# Patient Record
Sex: Female | Born: 1980 | Race: Black or African American | Hispanic: No | Marital: Single | Smoking: Never smoker
Health system: Southern US, Community
[De-identification: ages and names within clinical notes are randomized; demographics above are authoritative.]

## PROBLEM LIST (undated history)

## (undated) DIAGNOSIS — E786 Lipoprotein deficiency: Secondary | ICD-10-CM

## (undated) DIAGNOSIS — G4730 Sleep apnea, unspecified: Secondary | ICD-10-CM

## (undated) DIAGNOSIS — M179 Osteoarthritis of knee, unspecified: Secondary | ICD-10-CM

## (undated) DIAGNOSIS — E119 Type 2 diabetes mellitus without complications: Secondary | ICD-10-CM

## (undated) DIAGNOSIS — M1710 Unilateral primary osteoarthritis, unspecified knee: Secondary | ICD-10-CM

## (undated) DIAGNOSIS — I10 Essential (primary) hypertension: Secondary | ICD-10-CM

## (undated) DIAGNOSIS — F32A Depression, unspecified: Secondary | ICD-10-CM

## (undated) HISTORY — DX: Lipoprotein deficiency: E78.6

## (undated) HISTORY — DX: Osteoarthritis of knee, unspecified: M17.9

## (undated) HISTORY — DX: Morbid (severe) obesity due to excess calories: E66.01

## (undated) HISTORY — DX: Unilateral primary osteoarthritis, unspecified knee: M17.10

## (undated) HISTORY — PX: NO PAST SURGERIES: SHX2092

## (undated) HISTORY — DX: Type 2 diabetes mellitus without complications: E11.9

## (undated) HISTORY — DX: Essential (primary) hypertension: I10

---

## 2005-01-01 ENCOUNTER — Emergency Department (HOSPITAL_COMMUNITY): Admission: EM | Admit: 2005-01-01 | Discharge: 2005-01-01 | Payer: Self-pay | Admitting: Family Medicine

## 2007-12-18 ENCOUNTER — Other Ambulatory Visit: Admission: RE | Admit: 2007-12-18 | Discharge: 2007-12-18 | Payer: Self-pay | Admitting: Family Medicine

## 2012-01-21 ENCOUNTER — Other Ambulatory Visit (HOSPITAL_COMMUNITY)
Admission: RE | Admit: 2012-01-21 | Discharge: 2012-01-21 | Disposition: A | Discharge status: Home / Self Care | Payer: BC Managed Care – PPO | Source: Ambulatory Visit | Attending: Family Medicine | Admitting: Family Medicine

## 2012-01-21 DIAGNOSIS — Z0000 Encounter for general adult medical examination without abnormal findings: Secondary | ICD-10-CM | POA: Insufficient documentation

## 2012-12-09 ENCOUNTER — Other Ambulatory Visit: Payer: Self-pay | Admitting: Family Medicine

## 2012-12-11 ENCOUNTER — Ambulatory Visit
Admission: RE | Admit: 2012-12-11 | Discharge: 2012-12-11 | Disposition: A | Discharge status: Home / Self Care | Payer: BC Managed Care – PPO | Source: Ambulatory Visit | Attending: Family Medicine | Admitting: Family Medicine

## 2014-06-02 ENCOUNTER — Other Ambulatory Visit: Payer: Self-pay | Admitting: Family Medicine

## 2014-06-02 ENCOUNTER — Other Ambulatory Visit (HOSPITAL_COMMUNITY)
Admission: RE | Admit: 2014-06-02 | Discharge: 2014-06-02 | Disposition: A | Discharge status: Home / Self Care | Payer: BC Managed Care – PPO | Source: Ambulatory Visit | Attending: Family Medicine | Admitting: Family Medicine

## 2014-06-02 DIAGNOSIS — Z113 Encounter for screening for infections with a predominantly sexual mode of transmission: Secondary | ICD-10-CM | POA: Diagnosis present

## 2014-06-02 DIAGNOSIS — Z124 Encounter for screening for malignant neoplasm of cervix: Secondary | ICD-10-CM | POA: Insufficient documentation

## 2014-06-03 LAB — CYTOLOGY - PAP

## 2014-09-16 IMAGING — US US ABDOMEN COMPLETE
1 series · 13 of 25 positions shown · non-contrast
Comparison: None.

CLINICAL DATA: Elevated liver function test, on medication for
hypertension and diabetes

COMPLETE ABDOMINAL ULTRASOUND

[Series 1: us abdomen complete · 0.30mm/px · 13 of 70 slices shown]
[im 1/70]
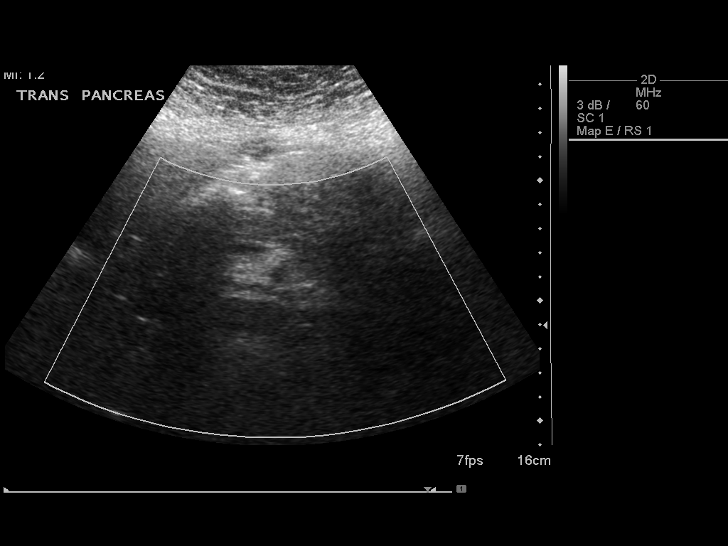
[im 6/70]
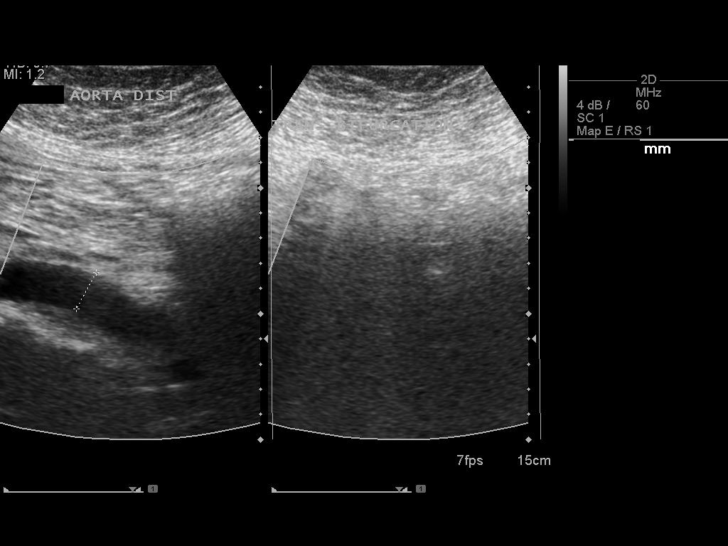
[im 12/70]
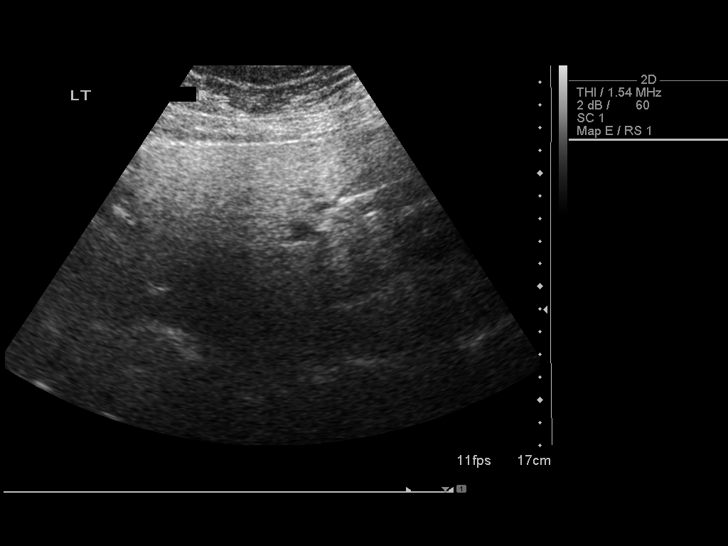
[im 18/70]
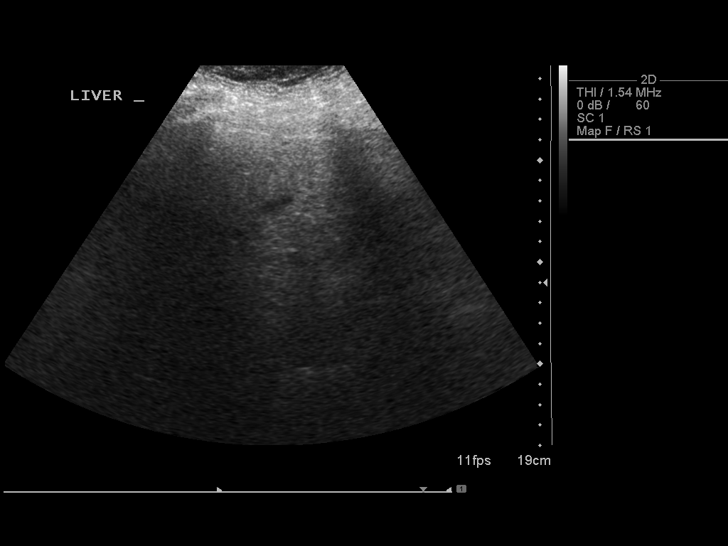
[im 24/70]
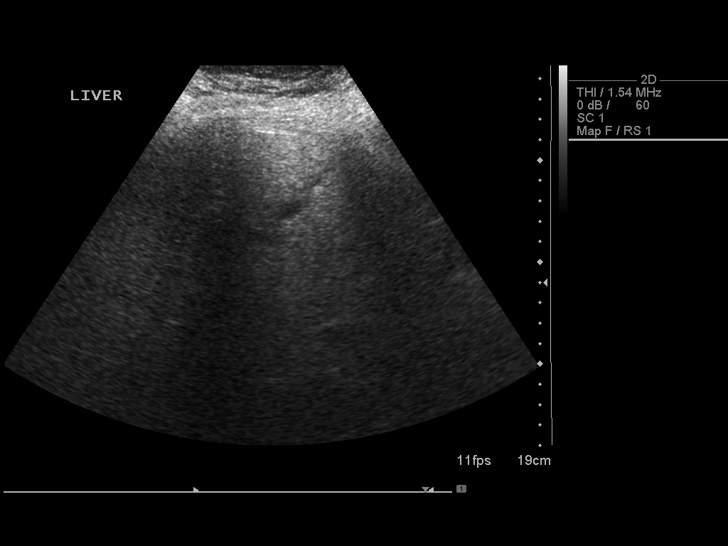
[im 29/70]
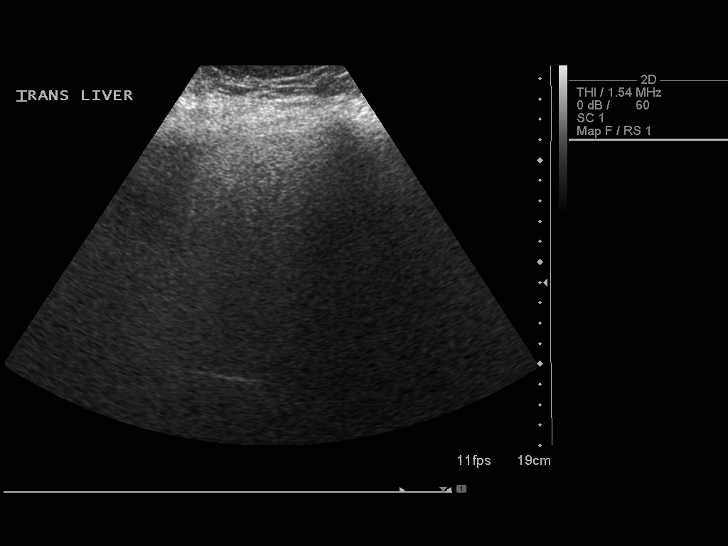
[im 35/70]
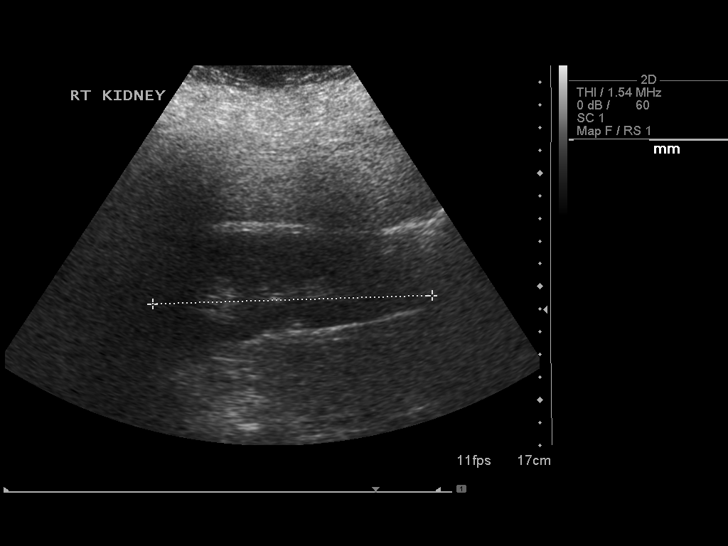
[im 41/70]
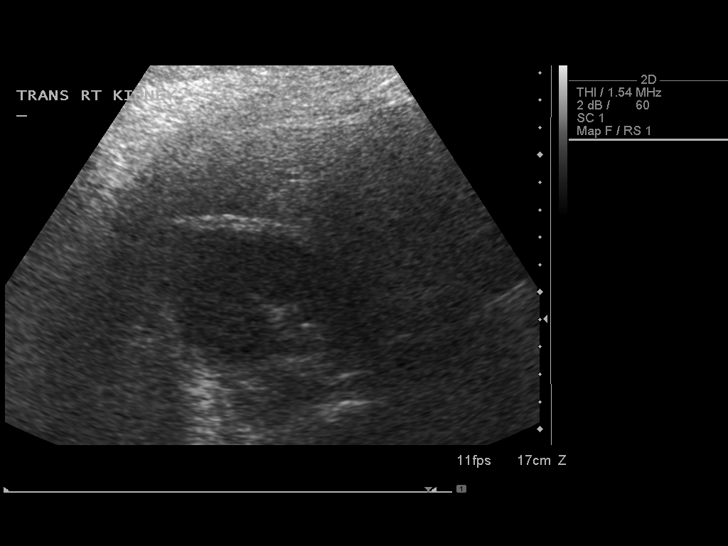
[im 47/70]
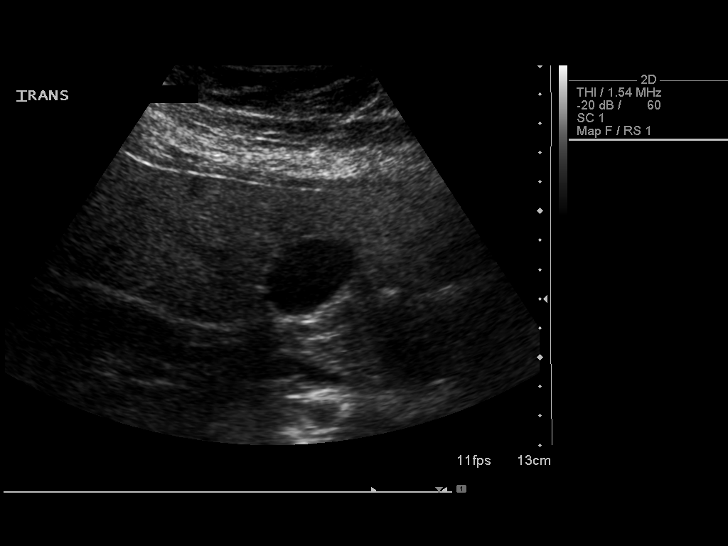
[im 52/70]
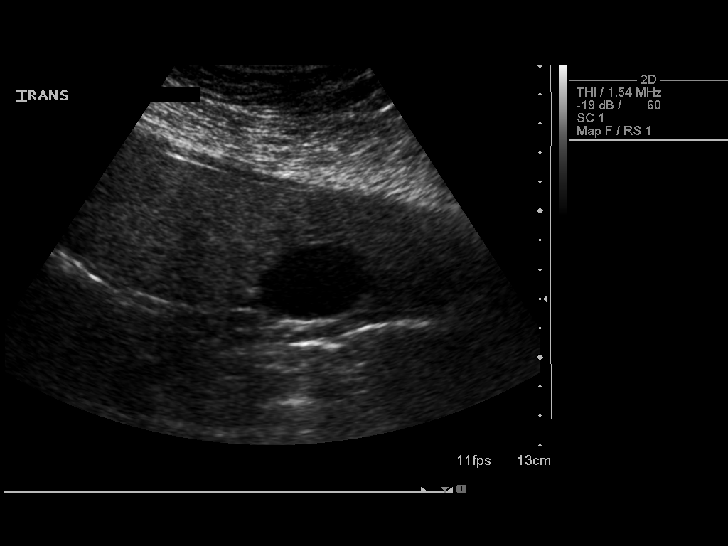
[im 58/70]
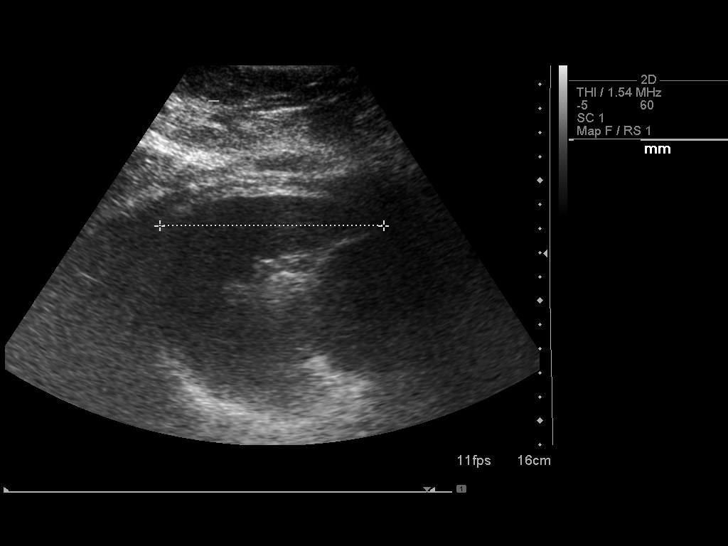
[im 64/70]
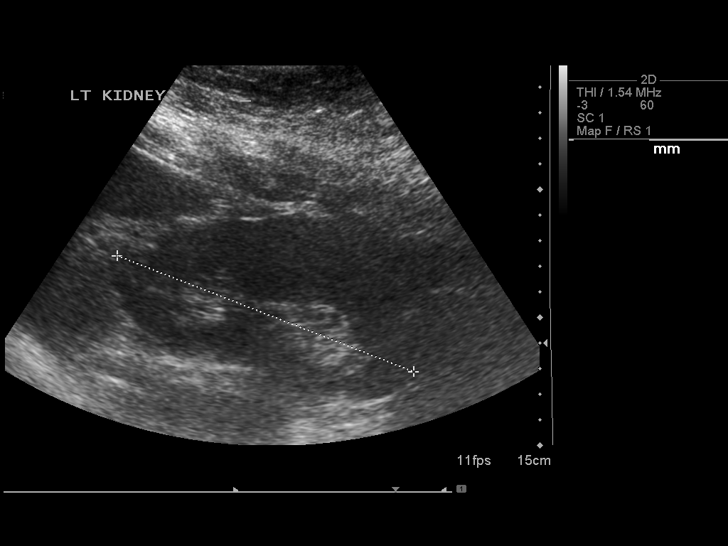
[im 70/70]
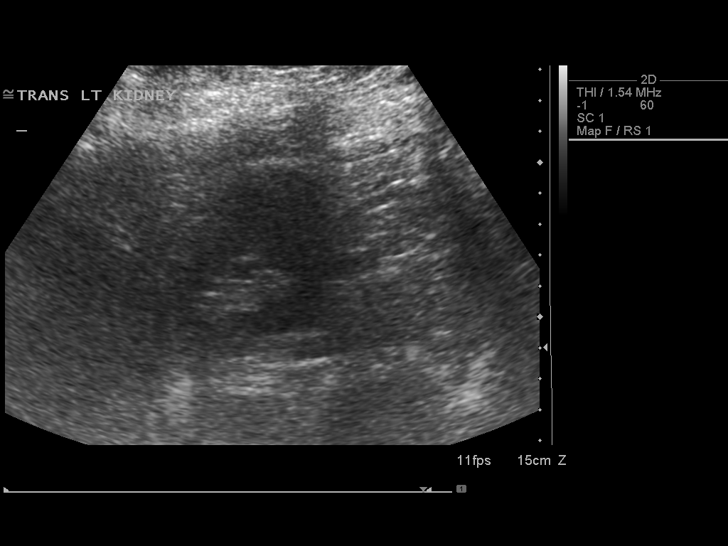

[13 of 25 positions shown; findings below may reference images not displayed]

FINDINGS: Gallbladder:  The gallbladder is visualized and no gallstones are
noted.  There is no pain over the gallbladder with compression.

Common bile duct:  The common bile duct is normal measuring 3.3 mm
in diameter.

Liver:  The liver is diffusely echogenic consistent with fatty
infiltration with probable focal sparing near the gallbladder.  The
liver is prominent measuring 17.8 cm sagittally.

IVC:  The IVC is obscured by body habitus and bowel gas.

Pancreas:  The pancreas also is completely obscured.

Spleen:  The spleen is normal measuring 9.3 cm sagittally.

Right Kidney:  No hydronephrosis is seen.  The right kidney
measures 12.3 cm sagittally.

Left Kidney:  No hydronephrosis is noted.  The left kidney measures
12.5 cm.

Abdominal aorta:  The abdominal aorta is obscured by body habitus.

Much of the anatomy is obscured by large body habitus and bowel
gas.
IMPRESSION: 1.  Echogenic liver parenchyma consistent with fatty infiltration
with areas of sparing.  Hepatomegaly.
2.  No gallstones.
3.  Much of the remainder of the anatomy is obscured by large body
habitus and bowel gas.

## 2015-12-06 ENCOUNTER — Encounter: Payer: Self-pay | Admitting: Neurology

## 2015-12-06 ENCOUNTER — Ambulatory Visit (INDEPENDENT_AMBULATORY_CARE_PROVIDER_SITE_OTHER): Payer: BC Managed Care – PPO | Admitting: Neurology

## 2015-12-06 DIAGNOSIS — R413 Other amnesia: Secondary | ICD-10-CM

## 2015-12-06 DIAGNOSIS — E1165 Type 2 diabetes mellitus with hyperglycemia: Secondary | ICD-10-CM | POA: Insufficient documentation

## 2015-12-06 DIAGNOSIS — I10 Essential (primary) hypertension: Secondary | ICD-10-CM | POA: Diagnosis not present

## 2015-12-06 DIAGNOSIS — R51 Headache: Secondary | ICD-10-CM | POA: Diagnosis not present

## 2015-12-06 DIAGNOSIS — R519 Headache, unspecified: Secondary | ICD-10-CM

## 2015-12-06 DIAGNOSIS — R4789 Other speech disturbances: Secondary | ICD-10-CM

## 2015-12-06 DIAGNOSIS — R4189 Other symptoms and signs involving cognitive functions and awareness: Secondary | ICD-10-CM | POA: Insufficient documentation

## 2015-12-06 DIAGNOSIS — IMO0002 Reserved for concepts with insufficient information to code with codable children: Secondary | ICD-10-CM | POA: Insufficient documentation

## 2015-12-06 DIAGNOSIS — E538 Deficiency of other specified B group vitamins: Secondary | ICD-10-CM | POA: Diagnosis not present

## 2015-12-06 NOTE — Patient Instructions (Signed)
Remember to drink plenty of fluid, eat healthy meals and do not skip any meals. Try to eat protein with a every meal and eat a healthy snack such as fruit or nuts in between meals. Try to keep a regular sleep-wake schedule and try to exercise daily, particularly in the form of walking, 20-30 minutes a day, if you can.   As far as diagnostic testing: MRI of the brain, Labs, Sleep evaluation. If all negative, can discuss Formal Neurocognitive testing  I would like to see you back in 4 months, sooner if we need to. Please call us with any interim questions, concerns, problems, updates or refill requests.   Our phone number is [phone]. We also have an after hours call service for urgent matters and there is a physician on-call for urgent questions. For any emergencies you know to call 911 or go to the nearest emergency room

## 2015-12-06 NOTE — Progress Notes (Signed)
[facility]    Provider:  Dr Lucia GaskinsAhern Referring Provider: Laurann MontanaWhite, Cynthia, MD Primary Care Physician:  Cala BradfordWHITE,CYNTHIA S, MD  CC:  Word-finding difficulty  HPI:  Stacey Graham is a 35 y.o. female here as a referral from Dr. Cliffton AstersWhite for word-finding difficulty. PMHx morbid obesity (BMI 66), uncontrolled diabetes(last hgba1c 10.3), HTN, arthritis, migraines and morning headaches. She is having some mild memory issues, like what she did last week on a certain day but then remembers. She stared noticing the word difficulty the last 2 years, worsening, happening more frequently and more consistently. May be an object, a name, a person, a place. She will find the word or will describe it or someone else will intercede. Happens every conversation that she has. No family history of dementia. Mom is alibe and 51 without any issues, is not close with father, father had Crohn's disease. Maternal grandparents deceased in their 6980s the other in 1960s without any memory issues. She is stressed with a recent job started 2015, she works in [employer]ances at [employer]. No one has noticed, just she notices. She snores, she is morbidly obese (BMI 66), she gets very tired during the day, she feel fatigued even waking up in the morning. She has been under stress the last 2 years but things are better, she is fatigued when she gets home. No significant mood disorder. She has woken up with headaches. She has a history of headaches and migraines. The migraine frequency is occasional and she has to sleep to feel better. She has woken up with headaches as well. She has a hx of migraines.  CMP was performed on 06/18/15 creatinine 0.92. TSH .98  hgba1c 10.3  Review of Systems: Patient complains of symptoms per HPI as well as the following symptoms: no CP, no SOB. Pertinent negatives per HPI. All others negative.   Social History   Social History  . Marital Status: Single    Spouse Name: N/A  . Number of Children: 0  .  Years of Education: 16   Occupational History  . 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 Smoking status: Never Smoker   . Smokeless tobacco: Not on file  . Alcohol Use: 0.0 oz/week    0 Standard drinks or equivalent per week     Comment: 1-2 glasses beer or wine per week  . Drug Use: No  . Sexual Activity: Not on file   Other Topics Concern  . Not on file   Social History Narrative   Lives alone   Caffeine use: coffee/soda/tea daily (1-2 per day)    Family History  Problem Relation Age of Onset  . Crohn's disease Father   . Hypertension Father   . Hypertension Mother   . Diabetes Mother   . CAD Mother   . Sickle cell anemia Maternal Grandmother   . Migraines Neg Hx   . Dementia Neg Hx   . Stroke Neg Hx   . Multiple sclerosis Neg Hx     Past Medical History  Diagnosis Date  . Diabetes mellitus (HCC)   . Hypertension   . OA (osteoarthritis) of knee     right  . HDL deficiency   . Morbid obesity [facility](HCC)     Past Surgical History  Procedure Laterality Date  . No past surgeries      Current Outpatient Prescriptions  Medication Sig Dispense Refill  . Bitter Melon 10 % POWD 1 Dose by Does not apply route.    .Marland Kitchen  Chromium-Cinnamon (CINNAMON PLUS CHROMIUM PO) Take 1 Dose by mouth daily. 2000/400    . levonorgestrel (MIRENA) 20 MCG/24HR IUD 1 each by Intrauterine route once.    Marland Kitchen lisinopril-hydrochlorothiazide (PRINZIDE,ZESTORETIC) 20-12.5 MG tablet Take 1 tablet by mouth daily.  1  . meloxicam (MOBIC) 15 MG tablet Take 15 mg by mouth daily.  1  . metFORMIN (GLUCOPHAGE) 1000 MG tablet Take 1,000 mg by mouth 2 (two) times daily.  1  . Probiotic Product (PROBIOTIC PO) Take 1 Dose by mouth daily. Yeast guard    . VITAMIN D, ERGOCALCIFEROL, PO Take 1,000 Units by mouth daily.     No current facility-administered medications for this visit.    Allergies as of 12/06/2015  . (Not on File)    Vitals: BP 128/77 mmHg  Pulse 83  Ht 5' 5.5" (1.664  m)  Wt 406 lb (184.16 kg)  BMI 66.51 kg/m2 Last Weight:  Wt Readings from Last 1 Encounters:  12/06/15 406 lb (184.16 kg)   Last Height:   Ht Readings from Last 1 Encounters:  12/06/15 5' 5.5" (1.664 m)    Physical exam: Exam: Gen: NAD, conversant, well nourised, morbidly obese, well groomed                     CV: RRR, no MRG. No Carotid Bruits. No peripheral edema, warm, nontender Eyes: Conjunctivae clear without exudates or hemorrhage  Neuro: Detailed Neurologic Exam  Speech:    Speech is normal; fluent and spontaneous with normal comprehension.  Cognition:    The patient is oriented to person, place, and time;     recent and remote memory intact;     language fluent;     normal attention, concentration,     fund of knowledge Cranial Nerves:    The pupils are equal, round, and reactive to light. The fundi are normal and spontaneous venous pulsations are present. Visual fields are full to finger confrontation. Extraocular movements are intact. Trigeminal sensation is intact and the muscles of mastication are normal. The face is symmetric. The palate elevates in the midline. Hearing intact. Voice is normal. Shoulder shrug is normal. The tongue has normal motion without fasciculations.   Coordination:    Normal finger to nose and heel to shin.    Gait:    Wide based due to exceptionally large body habitus  Motor Observation:    No asymmetry, no atrophy, and no involuntary movements noted. Tone:    Normal muscle tone.    Posture:    Posture is normal. normal erect    Strength:    Strength is V/V in the upper and lower limbs.      Sensation: intact to LT     Reflex Exam:  DTR's:    Deep tendon reflexes in the upper are normal and limited by severe morbid obesity in the lowers.   Toes:    The toes are equivocal bilaterally.   Clonus:    Clonus is absent.      Assessment/Plan:  ncontrolled diabetes(last hgba1c 10.3), HTN, arthritis. She is having some mild  memory issues.  Memory loss: Sleep eval: Morning headache, snoring, daytime fatigue, memory changes, morbid obesity (BMI 66) MRI of the brain - BMI 66 may need open MRI Check B12 and folate  Headaches: To prevent or relieve headaches, try the following: Cool Compress. Lie down and place a cool compress on your head.  Avoid headache triggers. If certain foods or odors seem to have triggered your  migraines in the past, avoid them. A headache diary might help you identify triggers.  Include physical activity in your daily routine. Try a daily walk or other moderate aerobic exercise.  Manage stress. Find healthy ways to cope with the stressors, such as delegating tasks on your to-do list.  Practice relaxation techniques. Try deep breathing, yoga, massage and visualization.  Eat regularly. Eating regularly scheduled meals and maintaining a healthy diet might help prevent headaches. Also, drink plenty of fluids.  Follow a regular sleep schedule. Sleep deprivation might contribute to headaches Consider biofeedback. With this mind-body technique, you learn to control certain bodily functions - such as muscle tension, heart rate and blood pressure - to prevent headaches or reduce headache pain.    Proceed to emergency room if you experience new or worsening symptoms or symptoms do not resolve, if you have new neurologic symptoms or if headache is severe, or for any concerning symptom.      Naomie Dean, MD  [facility] [address] [place], [place] [address]  Phone [phone] Fax [phone]

## 2015-12-07 LAB — CREATININE, SERUM
Creatinine, Ser: 1.21 mg/dL — ABNORMAL HIGH (ref 0.57–1.00)
GFR calc Af Amer: 67 mL/min/{1.73_m2} (ref >59)
GFR calc non Af Amer: 58 mL/min/{1.73_m2} — ABNORMAL LOW (ref >59)

## 2015-12-07 LAB — B12 AND FOLATE PANEL
Folate: >20 ng/mL (ref >3.0)
Vitamin B-12: 589 pg/mL (ref 211–946)

## 2015-12-08 ENCOUNTER — Telehealth: Payer: Self-pay | Admitting: *Deleted

## 2015-12-08 NOTE — Telephone Encounter (Signed)
-----   Message from Anson FretAntonia B Ahern, MD sent at 12/08/2015 10:41 AM EDT ----- B12 looks great. Her creatinine is elevated at 1.21, she should follow up with her primary care about that and have it repeated in the next few months to ensure there is nothing wrong with her kidneys thanks

## 2015-12-08 NOTE — Telephone Encounter (Signed)
LVM for pt to call about results. Gave [facility] phone number.  

## 2015-12-12 NOTE — Telephone Encounter (Signed)
LVM requesting patient call back for lab results. Left name, number.

## 2015-12-21 ENCOUNTER — Ambulatory Visit (INDEPENDENT_AMBULATORY_CARE_PROVIDER_SITE_OTHER): Payer: BC Managed Care – PPO | Admitting: Neurology

## 2015-12-21 ENCOUNTER — Encounter: Payer: Self-pay | Admitting: Neurology

## 2015-12-21 VITALS — BP 140/80 | HR 86 | Resp 20 | Ht 65.5 in | Wt 393.0 lb

## 2015-12-21 DIAGNOSIS — E669 Obesity, unspecified: Secondary | ICD-10-CM

## 2015-12-21 DIAGNOSIS — R51 Headache: Secondary | ICD-10-CM

## 2015-12-21 DIAGNOSIS — R0683 Snoring: Secondary | ICD-10-CM | POA: Diagnosis not present

## 2015-12-21 DIAGNOSIS — E662 Morbid (severe) obesity with alveolar hypoventilation: Secondary | ICD-10-CM

## 2015-12-21 DIAGNOSIS — R519 Headache, unspecified: Secondary | ICD-10-CM | POA: Insufficient documentation

## 2015-12-21 NOTE — Telephone Encounter (Signed)
Spoke to pt - she is aware of results and will follow up w/ PCP.

## 2015-12-21 NOTE — Patient Instructions (Signed)

## 2015-12-21 NOTE — Telephone Encounter (Signed)
Called and spoke to pt about lab results per Dr Ahern note. Pt verbalized understanding.  

## 2015-12-21 NOTE — Telephone Encounter (Signed)
Sent  Copy of labs to PCP per pt request VIA EPIC

## 2015-12-21 NOTE — Progress Notes (Signed)
[facility]   Provider:  Melvyn Novas, M D  Referring Provider:  Dr Naomie Dean, MD   Primary Care Physician:  Cala Bradford, MD  Chief Complaint  Patient presents with  . New Patient (Initial Visit)    snores, never had sleep study    HPI:  Stacey Graham is a 35 y.o. female , seen here as a referral  from Dr. 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 She is referred via Dr. Naomie Dean by whom she has been evaluated for word finding and cognitive memory issues. She had some trouble with naming objects. The same was naming people. Dr. Lucia Gaskins learned that she was tired during the day felt fatigued even when she woke up in the morning and has a history of headaches and migraines -she has woken up with headaches, she has never been woken by headaches. Usually, sleep is helpful to alleviate the headaches and she can sometimes "sleep them off ". Stacey Graham considers herself at night while meaning that she feels productive and actually alert and the latest hours of the day. She likes a later than average bedtime, and around her menstrual period she craves 9 or 10 hours of sleep. 2 years ago she begun working in a new responsibility at [employer] and her workday often  exceeded 12 hours. She noted that this fatigue her. She has cut back to an 8 hour or close to 8 hour work day and she feels that that has preserved some of her energies. She does not report a significant change in mood does not feel significantly depressed is not in pain or distress, she feels excessively  tired from the end of the workday on.  Sleep habits are as follows:  She wakes usually up with the help of her alarm and she is concerned that if she wouldn't have the alarm she would sleep and. This also contributes to the feeling of not being restored or refreshed in the morning. He was hit the snooze button several times.  Rising has not been easy for her. She does not take breakfast at home, she does not drink any coffee or tea in the morning she drinks it at her workplace however and she starts working usually around 7:30 and 8 AM her alarm is set for 6 .30 AM. Her workday and between 5 and 5:30, her workplace has daylight access she has a large window across her desk. She does take naps during lunch times- but shares an office.  In her bedroom she does watch TV but she puts it on a timer. She only recalls the first 5-10 minutes of each show she watches at night so it is a strong feeling that she is sleep with a number of minutes. She sleeps prone or on her side when she falls asleep. Friends and family have told her that she snores loudly but she sleeps usually alone. She uses to double stuff pillows. The bedroom is usually cool, quiet and dark she prefers to run a fan in the background. Currently she has more frequent nocturia as a result of diabetes medication. Nocturia is reported to take place 1-3 times each night. She can go to sleep easily after bathroom break.   Sleep medical history and family sleep history:  The patient's parents have no known diagnosis of apnea, the contact with her father is not close by  to her mother.   Social history:  She does not use tobacco products of any kind, she drinks our goal 1-2 drinks a week, caffeine use 1-2 cups of coffee a day no soda nor iced tea.  Review of Systems: Out of a complete 14 system review, the patient complains of only the following symptoms, and all other reviewed systems are negative. Stacey Graham reports nocturia, snoring, feeling daytime fatigue but having the same energy. She also was diagnosed with uncontrolled diabetes and in her last visit with Dr. Lucia Gaskins she mentioned an Hgb A1c at 10.3, when she had a repeat test done it was 8.6. Her BMI is super obese at 18 and this has also contributed to arthritis and knee pain, we will have to evaluate for the  possibility of obesity hypoventilation.  Epworth score 7 , Fatigue severity score 39  , depression score 2/15    Social History   Social History  . Marital status: Single    Spouse name: N/A  . Number of children: 0  . Years of education: 53   Occupational History  . Presenter, broadcasting at [employer]    Social History Main Topics  . Smoking status: Never Smoker  . Smokeless tobacco: Not on file  . Alcohol use 0.0 oz/week     Comment: 1-2 glasses beer or wine per week  . Drug use: No  . Sexual activity: Not on file   Other Topics Concern  . Not on file   Social History Narrative   Lives alone   Caffeine use: coffee/soda/tea daily (1-2 per day)    Family History  Problem Relation Age of Onset  . Crohn's disease Father   . Hypertension Father   . Hypertension Mother   . Diabetes Mother   . CAD Mother   . Sickle cell anemia Maternal Grandmother   . Migraines Neg Hx   . Dementia Neg Hx   . Stroke Neg Hx   . Multiple sclerosis Neg Hx     Past Medical History:  Diagnosis Date  . Diabetes mellitus (HCC)   . HDL deficiency   . Hypertension   . Morbid obesity (HCC)   . OA (osteoarthritis) of knee    right    Past Surgical History:  Procedure Laterality Date  . NO PAST SURGERIES      Current Outpatient Prescriptions  Medication Sig Dispense Refill  . Bitter Melon 10 % POWD 1 Dose by Does not apply route.    . Chromium-Cinnamon (CINNAMON PLUS CHROMIUM PO) Take 1 Dose by mouth daily. 2000/400    . JARDIANCE 25 MG TABS tablet Take 25 mg by mouth daily.    Marland Kitchen levonorgestrel (MIRENA) 20 MCG/24HR IUD 1 each by Intrauterine route once.    Marland Kitchen lisinopril-hydrochlorothiazide (PRINZIDE,ZESTORETIC) 20-12.5 MG tablet Take 1 tablet by mouth daily.  1  . meloxicam (MOBIC) 15 MG tablet Take 15 mg by mouth daily.  1  . metFORMIN (GLUCOPHAGE) 1000 MG tablet Take 1,000 mg by mouth 2 (two) times daily.  1  . Probiotic Product (PROBIOTIC PO) Take 1 Dose by mouth daily. Yeast guard      . VITAMIN D, ERGOCALCIFEROL, PO Take 1,000 Units by mouth daily.     No current facility-administered medications for this visit.     Allergies as of 12/21/2015  . (No Known Allergies)    Vitals: BP 140/80   Pulse 86   Resp 20   Ht 5' 5.5" (1.664 m)   Wt (!) 393  lb (178.3 kg)   BMI 64.40 kg/m  Last Weight:  Wt Readings from Last 1 Encounters:  12/21/15 (!) 393 lb (178.3 kg)   ZOX:WRUE mass index is 64.4 kg/m.     Last Height:   Ht Readings from Last 1 Encounters:  12/21/15 5' 5.5" (1.664 m)    Physical exam:  General: The patient is awake, alert and appears not in acute distress. The patient is well groomed. Head: Normocephalic, atraumatic. Neck is supple. Mallampati 3 neck circumference:19.25 . Nasal airflow restricted , TMJ is  Not  evident . Retrognathia is seen.  Cardiovascular:  Regular rate and rhythm , without  murmurs or carotid bruit, and without distended neck veins. Respiratory: Lungs are clear to auscultation. Skin:  Without evidence of edema, or rash Trunk: BMI is super obese . The patient's posture is erect   Neurologic exam : The patient is awake and alert, oriented to place and time.   Attention span & concentration ability appears normal.  Speech is fluent,  without dysarthria, dysphonia or aphasia.  Mood and affect are appropriate.  Cranial nerves: Pupils are equal and briskly reactive to light. Funduscopic exam without  evidence of pallor or edema. Extraocular movements  in vertical and horizontal planes intact and without nystagmus. Visual fields by finger perimetry are intact. Hearing to finger rub intact.   Facial sensation intact to fine touch.  Facial motor strength is symmetric and tongue and uvula move midline. Shoulder shrug was symmetrical.   Motor exam:   Normal tone, muscle bulk and symmetric strength in all extremities.  Sensory:  Fine touch, pinprick and vibration were tested in all extremities. Proprioception tested in the upper  extremities was normal.  Coordination: Rapid alternating movements in the fingers/hands was normal. Finger-to-nose maneuver  normal without evidence of ataxia, dysmetria or tremor.  Gait and station: Patient walks without assistive device and is able unassisted to climb up to the exam table. Strength within normal limits.  Stance is stable and normal.  Turns with Steps. Romberg testing is negative.  Deep tendon reflexes: in the upper and lower extremities are symmetric and intact. Babinski maneuver response is  downgoing.  The patient was advised of the nature of the diagnosed sleep disorder , the treatment options and risks for general a health and wellness arising from not treating the condition.  I spent more than 40 minutes of face to face time with the patient. Greater than 50% of time was spent in counseling and coordination of care. We have discussed the diagnosis and differential and I answered the patient's questions.     Assessment:  After physical and neurologic examination, review of laboratory studies,  Personal review of imaging studies, reports of other /same  Imaging studies ,  Results of polysomnography/ neurophysiology testing and pre-existing records as far as provided in visit., my assessment is   1) I agree with Dr. Trevor Mace assessment that Stacey Graham is a very high risk of having obstructive sleep apnea and given her body mass index she may have obesity hypoventilation. Sleep related headaches as she reports can be related to hypercapnia and hypoxemia at night. She has been witnessed to snore loudly over several years now. Her daytime fatigue can be contributed to by her uncontrolled diabetes and recently medication regimen and diet have changed. This has also led to some nocturia. My goal is to evaluate the patient in a split-night polysomnography with capnography and to treat her with CPAP if she qualifies for the first  2 hours of sleep. For a patient with her risk factors I  would not recommend an unattended sleep study. Should hypoxemia or hypercapnia be found and be present for prolonged periods of time as CPAP or BiPAP is usually the treatment of choice. The patient has mild apnea is mainly snoring and does not suffer from hypoxemia, a dental device can be applied.   Plan:  Treatment plan and additional workup :  SPLIT night , Capnography.      Stacey Mylar Taisia Fantini MD  12/21/2015   CC: Laurann Montana, Md [address] [place], [place] [address]

## 2015-12-28 ENCOUNTER — Other Ambulatory Visit: Payer: BC Managed Care – PPO

## 2016-01-01 ENCOUNTER — Ambulatory Visit (INDEPENDENT_AMBULATORY_CARE_PROVIDER_SITE_OTHER): Payer: BC Managed Care – PPO | Admitting: Neurology

## 2016-01-01 DIAGNOSIS — E669 Obesity, unspecified: Secondary | ICD-10-CM

## 2016-01-01 DIAGNOSIS — R51 Headache: Secondary | ICD-10-CM

## 2016-01-01 DIAGNOSIS — G4733 Obstructive sleep apnea (adult) (pediatric): Secondary | ICD-10-CM

## 2016-01-01 DIAGNOSIS — E662 Morbid (severe) obesity with alveolar hypoventilation: Secondary | ICD-10-CM

## 2016-01-01 DIAGNOSIS — R0683 Snoring: Secondary | ICD-10-CM

## 2016-01-01 DIAGNOSIS — R519 Headache, unspecified: Secondary | ICD-10-CM

## 2016-01-03 ENCOUNTER — Ambulatory Visit
Admission: RE | Admit: 2016-01-03 | Discharge: 2016-01-03 | Disposition: A | Discharge status: Home / Self Care | Payer: BC Managed Care – PPO | Source: Ambulatory Visit | Attending: Neurology | Admitting: Neurology

## 2016-01-03 DIAGNOSIS — R413 Other amnesia: Secondary | ICD-10-CM

## 2016-01-03 DIAGNOSIS — R4789 Other speech disturbances: Secondary | ICD-10-CM | POA: Diagnosis not present

## 2016-01-03 DIAGNOSIS — R519 Headache, unspecified: Secondary | ICD-10-CM

## 2016-01-03 DIAGNOSIS — R51 Headache: Secondary | ICD-10-CM

## 2016-01-03 MED ORDER — GADOBENATE DIMEGLUMINE 529 MG/ML IV SOLN
20.0000 mL | Freq: Once | INTRAVENOUS | Status: AC | PRN
  Administered 2016-01-03: 20 mL via INTRAVENOUS

## 2016-01-06 ENCOUNTER — Telehealth: Payer: Self-pay | Admitting: *Deleted

## 2016-01-06 NOTE — Telephone Encounter (Signed)
Spoke to pt and relayed the results of her normal MRI.  She verbalized understanding.

## 2016-01-06 NOTE — Telephone Encounter (Signed)
-----   Message from Anson FretAntonia B Ahern, MD sent at 01/05/2016  5:53 PM EDT ----- Mri of the brain normal for age thanks

## 2016-01-10 ENCOUNTER — Telehealth: Payer: Self-pay

## 2016-01-10 DIAGNOSIS — G4733 Obstructive sleep apnea (adult) (pediatric): Secondary | ICD-10-CM

## 2016-01-10 NOTE — Telephone Encounter (Signed)
I spoke to pt. I advised her that her sleep study results showed that she does have sleep apnea, but cpap was effective in treating her apnea, and that Dr. Vickey Hugerohmeier recommends starting a cpap. Pt is agreeable to starting a cpap. I advised her that I would send her cpap order to [facility] and they would call her within a week to get it set up. I advised pt that her PCP may check her thyroid function to rule out hypothyroidism as clinically indicated. Pt is not taking narcotics at this time. I advised pt to avoid caffeine containing beverages and chocolate. Pt verbalized understanding of results. Pt asked that a copy of her sleep study be faxed to Dr. Cliffton AstersWhite. A follow up appt was made for 10/17 at 3:30. Pt had no questions at this time but was encouraged to call back if questions arise.

## 2016-03-13 ENCOUNTER — Ambulatory Visit: Payer: Self-pay | Admitting: Neurology

## 2016-03-19 ENCOUNTER — Telehealth: Payer: Self-pay

## 2016-03-19 ENCOUNTER — Encounter: Payer: Self-pay | Admitting: Neurology

## 2016-03-19 NOTE — Telephone Encounter (Signed)
I spoke to pt and advised her that her 03/22/2016 appt with Dr. Vickey Hugerohmeier will need to be r/s because Dr. Vickey Hugerohmeier will not be seeing pts that day. Pt is agreeable to a 03/11/2016 at 9:30 am appt. Pt says this is the second time her appt has been rescheduled. Pt verbalized understanding of new appt date and time.

## 2016-03-21 ENCOUNTER — Encounter: Payer: Self-pay | Admitting: Neurology

## 2016-03-21 ENCOUNTER — Ambulatory Visit (INDEPENDENT_AMBULATORY_CARE_PROVIDER_SITE_OTHER): Payer: BC Managed Care – PPO | Admitting: Neurology

## 2016-03-21 VITALS — BP 118/78 | HR 68 | Resp 20 | Ht 66.0 in | Wt 399.0 lb

## 2016-03-21 DIAGNOSIS — G4733 Obstructive sleep apnea (adult) (pediatric): Secondary | ICD-10-CM | POA: Diagnosis not present

## 2016-03-21 DIAGNOSIS — Z9989 Dependence on other enabling machines and devices: Secondary | ICD-10-CM

## 2016-03-21 NOTE — Patient Instructions (Signed)

## 2016-03-21 NOTE — Progress Notes (Signed)
SLEEP MEDICINE CLINIC   Provider:  Melvyn Novas, M D  Referring Provider:  Dr Naomie Dean, MD   Primary Care Physician:  Cala Bradford, MD  Chief Complaint  Patient presents with  . Follow-up    cpap    HPI:  Stacey Graham is a 35 y.o. female , seen here as a referral  from Dr. 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 She is referred via Dr. Naomie Dean by whom she has been evaluated for word finding and cognitive memory issues. She had some trouble with naming objects. The same was naming people. Dr. Lucia Gaskins learned that she was tired during the day felt fatigued even when she woke up in the morning and has a history of headaches and migraines -she has woken up with headaches, she has never been woken by headaches. Usually, sleep is helpful to alleviate the headaches and she can sometimes "sleep them off ". Stacey Graham considers herself at night while meaning that she feels productive and actually alert and the latest hours of the day. She likes a later than average bedtime, and around her menstrual period she craves 9 or 10 hours of sleep. 2 years ago she begun working in a new responsibility at [employer] and her workday often  exceeded 12 hours. She noted that this fatigue her. She has cut back to an 8 hour or close to 8 hour work day and she feels that that has preserved some of her energies. She does not report a significant change in mood does not feel significantly depressed is not in pain or distress, she feels excessively  tired from the end of the workday on.  Sleep habits are as follows: She wakes usually up with the help of her alarm and she is concerned that if she wouldn't have the alarm she would sleep and. This also contributes to the feeling of not being restored or refreshed in the morning. He was hit the snooze button several times. Rising has not been easy for her. She does not  take breakfast at home, she does not drink any coffee or tea in the morning she drinks it at her workplace however and she starts working usually around 7:30 and 8 AM her alarm is set for 6 .30 AM. Her workday and between 5 and 5:30, her workplace has daylight access she has a large window across her desk. She does take naps during lunch times- but shares an office.  In her bedroom she does watch TV but she puts it on a timer. She only recalls the first 5-10 minutes of each show she watches at night so it is a strong feeling that she is sleep with a number of minutes. She sleeps prone or on her side when she falls asleep. Friends and family have told her that she snores loudly but she sleeps usually alone. She uses to double stuff pillows. The bedroom is usually cool, quiet and dark she prefers to run a fan in the background. Currently she has more frequent nocturia as a result of diabetes medication. Nocturia is reported to take place 1-3 times each night. She can go to sleep easily after bathroom break.   Sleep medical history and family sleep history:  The patient's parents have no known diagnosis of apnea, the contact with her father is not close by to her mother. Social history:  She does  not use tobacco products of any kind, she drinks our goal 1-2 drinks a week, caffeine use 1-2 cups of coffee a day no soda nor iced tea.  Interval history from 03/21/2016. I see Stacey Graham today, an established patient of Dr. Naomie Dean. The patient underwent a split-night polysomnography on 01/01/2016 was diagnosed with an AHI of 31.8 per hour of sleep without significant supine exenteration and without significant REM accentuation. She also did not have periodic limb movements she had some oxygen desaturation and was titrated to a pressure of CPAP at 10 cm water reaching an AHI of 0.0 I prescribed CPAP at 10 cm water with a ResMed air-fluid P 10 which can be exchanged for another interface depending on the  patient's liking. She has had some trouble with dislodging the nasal pillow when sleeping on her side.  I'm also able today to see her compliance record it was 93% for use days and 60% for over 4 hours of consecutive use. Her average user time is 4 hours 41 minutes at 10 cm water pressure with 3 cm EPR and her residual AHI is ideal at 0.4 apneas per hour I did see high air leaks in some nights. I like to add that the Epworth score has been decreased from 7-4 points and the fatigue severity score from 39 to 29.   Review of Systems: Out of a complete 14 system review, the patient complains of only the following symptoms, and all other reviewed systems are negative. Stacey Graham reports nocturia, snoring, feeling daytime fatigue but having the same energy. She also was diagnosed with uncontrolled diabetes and in her last visit with Dr. Lucia Gaskins she mentioned an Hgb A1c at 10.3, when she had a repeat test done it was 8.6. Her BMI is super obese at 39 and this has also contributed to arthritis and knee pain, we will have to evaluate for the possibility of obesity hypoventilation.   Epworth score 4 , Fatigue severity score 29  , depression score 2/15    Social History   Social History  . Marital status: Single    Spouse name: N/A  . Number of children: 0  . Years of education: 75   Occupational History  . Presenter, broadcasting at [employer]    Social History Main Topics  . Smoking status: Never Smoker  . Smokeless tobacco: Not on file  . Alcohol use 0.0 oz/week     Comment: 1-2 glasses beer or wine per week  . Drug use: No  . Sexual activity: Not on file   Other Topics Concern  . Not on file   Social History Narrative   Lives alone   Caffeine use: coffee/soda/tea daily (1-2 per day)    Family History  Problem Relation Age of Onset  . Crohn's disease Father   . Hypertension Father   . Hypertension Mother   . Diabetes Mother   . CAD Mother   . Sickle cell anemia Maternal Grandmother    . Migraines Neg Hx   . Dementia Neg Hx   . Stroke Neg Hx   . Multiple sclerosis Neg Hx     Past Medical History:  Diagnosis Date  . Diabetes mellitus (HCC)   . HDL deficiency   . Hypertension   . Morbid obesity (HCC)   . OA (osteoarthritis) of knee    right    Past Surgical History:  Procedure Laterality Date  . NO PAST SURGERIES      Current Outpatient Prescriptions  Medication Sig Dispense Refill  . Bitter Melon 10 % POWD 1 Dose by Does not apply route.    . Chromium-Cinnamon (CINNAMON PLUS CHROMIUM PO) Take 1 Dose by mouth daily. 2000/400    . levonorgestrel (MIRENA) 20 MCG/24HR IUD 1 each by Intrauterine route once.    Marland Kitchen lisinopril-hydrochlorothiazide (PRINZIDE,ZESTORETIC) 20-12.5 MG tablet Take 1 tablet by mouth daily.  1  . meloxicam (MOBIC) 15 MG tablet Take 15 mg by mouth daily.  1  . Probiotic Product (PROBIOTIC PO) Take 1 Dose by mouth daily. Yeast guard    . SYNJARDY 12.09-998 MG TABS Take 1 tablet by mouth 2 (two) times daily.    Marland Kitchen VITAMIN D, ERGOCALCIFEROL, PO Take 1,000 Units by mouth daily.     No current facility-administered medications for this visit.     Allergies as of 03/21/2016  . (No Known Allergies)    Vitals: BP 118/78   Pulse 68   Resp 20   Ht 5\' 6"  (1.676 m)   Wt (!) 399 lb (181 kg)   BMI 64.40 kg/m  Last Weight:  Wt Readings from Last 1 Encounters:  03/21/16 (!) 399 lb (181 kg)   MVH:QION mass index is 64.4 kg/m.     Last Height:   Ht Readings from Last 1 Encounters:  03/21/16 5\' 6"  (1.676 m)    Physical exam:  General: The patient is awake, alert and appears not in acute distress. The patient is well groomed. Head: Normocephalic, atraumatic. Neck is supple. Mallampati 3 neck circumference:19.25 . Nasal airflow restricted , TMJ is  Not  evident . Retrognathia is seen.  Cardiovascular:  Regular rate and rhythm , without  murmurs or carotid bruit, and without distended neck veins. Respiratory: Lungs are clear to  auscultation. Skin:  Without evidence of edema, or rash Trunk: BMI is super obese . The patient's posture is erect   Neurologic exam : The patient is awake and alert, oriented to place and time.   Cranial nerves: Pupils are equal and briskly reactive to light. Extraocular movements  in vertical and horizontal planes intact and without nystagmus. Visual fields by finger perimetry are intact. Hearing to finger rub intact. Facial sensation intact to fine touch. Facial motor strength is symmetric and tongue and uvula move midline. Shoulder shrug was symmetrical.   Motor exam:  Normal tone, muscle bulk and symmetric strength in all extremities.  The patient was advised of the nature of the diagnosed sleep disorder , the treatment options and risks for general a health and wellness arising from not treating the condition.  I spent more than 25 minutes of face to face time with the patient. Greater than 50% of time was spent in counseling and coordination of care. We have discussed the diagnosis and differential and I answered the patient's questions.     Assessment:  After physical and neurologic examination, review of laboratory studies,  Personal review of imaging studies, reports of other /same  Imaging studies ,  Results of polysomnography/ neurophysiology testing and pre-existing records as far as provided in visit., my assessment is   1) I agree with Dr. Trevor Mace assessment that Stacey Graham is a very high risk of having obstructive sleep apnea -given her body mass index . Sleep related headaches as she reports can be related to hypercapnia and hypoxemia at night. She has been witnessed to snore loudly over several years now. Her daytime fatigue can be contributed to by her uncontrolled diabetes and recently medication regimen and diet  have changed. This has also led to some nocturia.   Based on his sleep study she was diagnosed was rather moderate to severe apnea and prolonged oxygen  desaturations, a dental device could still be an option should she not want to use CPAP any longer. However at 10 cm water pressure her AHI has been significantly reduced and so has her fatigue and daytime sleepiness. A maintenance success is that her headaches have been almost rare now and since she's using CPAP she has had no intense headaches.  Plan:  Treatment plan and additional workup : Continue using CPAP    Porfirio Mylararmen Dahl Higinbotham MD  03/21/2016  CC; Naomie DeanAntonia Ahern, MD   CC: Laurann Montanaynthia White, Md [address] [place], [place] [id number]

## 2016-03-22 ENCOUNTER — Ambulatory Visit: Payer: Self-pay | Admitting: Neurology

## 2016-04-10 ENCOUNTER — Encounter: Payer: Self-pay | Admitting: Neurology

## 2016-04-10 ENCOUNTER — Ambulatory Visit (INDEPENDENT_AMBULATORY_CARE_PROVIDER_SITE_OTHER): Payer: BC Managed Care – PPO | Admitting: Neurology

## 2016-04-10 VITALS — BP 129/80 | HR 90 | Ht 66.0 in | Wt 395.4 lb

## 2016-04-10 DIAGNOSIS — Z9989 Dependence on other enabling machines and devices: Secondary | ICD-10-CM | POA: Diagnosis not present

## 2016-04-10 DIAGNOSIS — E662 Morbid (severe) obesity with alveolar hypoventilation: Secondary | ICD-10-CM

## 2016-04-10 DIAGNOSIS — G4733 Obstructive sleep apnea (adult) (pediatric): Secondary | ICD-10-CM | POA: Diagnosis not present

## 2016-04-10 NOTE — Progress Notes (Signed)
[facility]    Provider:  Dr Lucia GaskinsAhern Referring Provider: Laurann MontanaWhite, Cynthia, MD Primary Care Physician:  Cala BradfordWHITE,CYNTHIA S, MD   CC:  Word-finding difficulty, memory loss, headaches  Interval history: patient diagnosed with sleep apnea now on cpap and doing much better. She has moderate to severe sleep apnea and prolonged oxygen desaturation. Headaches are improved. Memory is improved. She is workig on weight loss with her primary care.   HPI:  Stacey Graham is a 35 y.o. female here as a referral from Dr. Cliffton AstersWhite for word-finding difficulty. PMHx morbid obesity (BMI 66), uncontrolled diabetes(last hgba1c 10.3), HTN, arthritis, migraines and morning headaches. She is having some mild memory issues, like what she did last week on a certain day but then remembers. She stared noticing the word difficulty the last 2 years, worsening, happening more frequently and more consistently. May be an object, a name, a person, a place. She will find the word or will describe it or someone else will intercede. Happens every conversation that she has. No family history of dementia. Mom is alibe and 51 without any issues, is not close with father, father had Crohn's disease. Maternal grandparents deceased in their 3580s the other in 6060s without any memory issues. She is stressed with a recent job started 2015, she works in [employer]ances at [employer]. No one has noticed, just she notices. She snores, she is morbidly obese (BMI 66), she gets very tired during the day, she feel fatigued even waking up in the morning. She has been under stress the last 2 years but things are better, she is fatigued when she gets home. No significant mood disorder. She has woken up with headaches. She has a history of headaches and migraines. The migraine frequency is occasional and she has to sleep to feel better. She has woken up with headaches as well. She has a hx of migraines.  CMP was performed on 06/18/15 creatinine 0.92. TSH  .98  hgba1c 10.3  Review of Systems: Patient complains of symptoms per HPI as well as the following symptoms: no CP, no SOB. Pertinent negatives per HPI. All others negative.    Social History   Social History  . Marital status: Single    Spouse name: N/A  . Number of children: 0  . Years of education: 7916   Occupational History  . 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 Smoking status: Never Smoker  . Smokeless tobacco: Never Used  . Alcohol use 0.0 oz/week     Comment: 1-2 glasses beer or wine per week  . Drug use: No  . Sexual activity: Not on file   Other Topics Concern  . Not on file   Social History Narrative   Lives alone   Caffeine use: coffee/soda/tea daily (1-2 per day)    Family History  Problem Relation Age of Onset  . Crohn's disease Father   . Hypertension Father   . Hypertension Mother   . Diabetes Mother   . CAD Mother   . Sickle cell anemia Maternal Grandmother   . Migraines Neg Hx   . Dementia Neg Hx   . Stroke Neg Hx   . Multiple sclerosis Neg Hx     Past Medical History:  Diagnosis Date  . Diabetes mellitus (HCC)   . HDL deficiency   . Hypertension   . Morbid obesity (HCC)   . OA (osteoarthritis) of knee    right    Past Surgical History:  Procedure Laterality Date  . NO PAST SURGERIES      Current Outpatient Prescriptions  Medication Sig Dispense Refill  . Acetaminophen (TYLENOL) 325 MG CAPS Take 2 tablets by mouth as needed.    . Bitter Melon 10 % POWD 1 Dose by Does not apply route.    . Chromium-Cinnamon (CINNAMON PLUS CHROMIUM PO) Take 1 Dose by mouth daily. 2000/400    . levonorgestrel (MIRENA) 20 MCG/24HR IUD 1 each by Intrauterine route once.    Marland Kitchen. lisinopril-hydrochlorothiazide (PRINZIDE,ZESTORETIC) 20-12.5 MG tablet Take 1 tablet by mouth daily.  1  . meloxicam (MOBIC) 15 MG tablet Take 15 mg by mouth daily.  1  . Probiotic Product (PROBIOTIC PO) Take 1 Dose by mouth daily. Yeast guard    .  SYNJARDY 12.09-998 MG TABS Take 1 tablet by mouth 2 (two) times daily.    Marland Kitchen. VITAMIN D, ERGOCALCIFEROL, PO Take 1,000 Units by mouth daily.     No current facility-administered medications for this visit.     Allergies as of 04/10/2016  . (No Known Allergies)    Vitals: BP 129/80 (BP Location: Right Arm, Patient Position: Sitting, Cuff Size: Large)   Pulse 90   Ht 5\' 6"  (1.676 m)   Wt (!) 395 lb 6.4 oz (179.4 kg)   BMI 63.82 kg/m  Last Weight:  Wt Readings from Last 1 Encounters:  04/10/16 (!) 395 lb 6.4 oz (179.4 kg)   Last Height:   Ht Readings from Last 1 Encounters:  04/10/16 5\' 6"  (1.676 m)       Physical exam: Exam: Gen: NAD, conversant, well nourised, morbidly obese, well groomed                     CV: RRR, no MRG. No Carotid Bruits. No peripheral edema, warm, nontender Eyes: Conjunctivae clear without exudates or hemorrhage  Neuro: Detailed Neurologic Exam  Speech:    Speech is normal; fluent and spontaneous with normal comprehension.  Cognition:    The patient is oriented to person, place, and time;     recent and remote memory intact;     language fluent;     normal attention, concentration,     fund of knowledge Cranial Nerves:    The pupils are equal, round, and reactive to light. The fundi are normal and spontaneous venous pulsations are present. Visual fields are full to finger confrontation. Extraocular movements are intact. Trigeminal sensation is intact and the muscles of mastication are normal. The face is symmetric. The palate elevates in the midline. Hearing intact. Voice is normal. Shoulder shrug is normal. The tongue has normal motion without fasciculations.   Coordination:    Normal finger to nose and heel to shin.    Gait:    Wide based due to exceptionally large body habitus  Motor Observation:    No asymmetry, no atrophy, and no involuntary movements noted. Tone:    Normal muscle tone.    Posture:    Posture is normal. normal  erect    Strength:    Strength is V/V in the upper and lower limbs.      Sensation: intact to LT     Reflex Exam:  DTR's:    Deep tendon reflexes in the upper are normal and limited by severe morbid obesity in the lowers.   Toes:    The toes are equivocal bilaterally.   Clonus:    Clonus is absent.      Assessment/Plan:  Patient diagnosed  with sleep apnea now on cpap and doing much better. She has moderate to severe sleep apnea and prolonged oxygen desaturation. Headaches are improved. Memory is improved. She is workig on weight loss with her primary care.   Continue cpap. Follow up as needed.  Naomie Dean, MD  [facility] [address] [place], [place] [address]  Phone [phone] Fax [phone]  A total of 15 minutes was spent face-to-face with this patient. Over half this time was spent on counseling patient on the o2 saturation, OSA diagnosis and different diagnostic and therapeutic options available.

## 2016-04-11 ENCOUNTER — Telehealth: Payer: Self-pay

## 2016-04-11 NOTE — Telephone Encounter (Signed)
Patient came to sleep lab for a mask fitting. She wears nasal pillows and likes them but it is leaking when she turns over. I gave her a size large in pillows to try. They fit well. She thinks hers is a medium. This should help with leak. I also gave her a F&P Piliaro nasal mask to try. It is nasal pillows with a guard around it. Should help her leak also.

## 2017-01-31 ENCOUNTER — Other Ambulatory Visit: Payer: Self-pay | Admitting: Family Medicine

## 2017-01-31 ENCOUNTER — Other Ambulatory Visit (HOSPITAL_COMMUNITY)
Admission: RE | Admit: 2017-01-31 | Discharge: 2017-01-31 | Disposition: A | Discharge status: Home / Self Care | Payer: BC Managed Care – PPO | Source: Ambulatory Visit | Attending: Family Medicine | Admitting: Family Medicine

## 2017-01-31 DIAGNOSIS — Z124 Encounter for screening for malignant neoplasm of cervix: Secondary | ICD-10-CM | POA: Diagnosis not present

## 2017-02-05 LAB — CYTOLOGY - PAP: HPV: DETECTED — AB

## 2017-03-11 ENCOUNTER — Other Ambulatory Visit: Payer: Self-pay | Admitting: Obstetrics and Gynecology

## 2017-03-20 ENCOUNTER — Ambulatory Visit: Payer: BC Managed Care – PPO | Admitting: Adult Health

## 2017-07-04 ENCOUNTER — Encounter: Payer: Self-pay | Admitting: Adult Health

## 2017-07-04 ENCOUNTER — Ambulatory Visit: Payer: BC Managed Care – PPO | Admitting: Adult Health

## 2017-07-04 VITALS — BP 129/74 | HR 100 | Ht 66.0 in | Wt 394.8 lb

## 2017-07-04 DIAGNOSIS — Z9989 Dependence on other enabling machines and devices: Secondary | ICD-10-CM

## 2017-07-04 DIAGNOSIS — G4733 Obstructive sleep apnea (adult) (pediatric): Secondary | ICD-10-CM

## 2017-07-04 NOTE — Patient Instructions (Addendum)
Your Plan:  Restart CPAP  Please call [employer] at [phone], and press option 1 when prompted. Their customer service representatives will be glad to assist you with inquiries about a new machine. If they are unable to answer, please leave a message and they will call you back. Make sure to leave your name and return phone number.  Thank you for coming [facility] at [facility]. I hope we have been able to provide you high quality care today.  You may receive a patient satisfaction survey over the next few weeks. We would appreciate your feedback and comments so that we may continue to improve ourselves and the health of our patients.   Sleep Apnea Sleep apnea is a condition in which breathing pauses or becomes shallow during sleep. Episodes of sleep apnea usually last 10 seconds or longer, and they may occur as many as 20 times an hour. Sleep apnea disrupts your sleep and keeps your body from getting the rest that it needs. This condition can increase your risk of certain health problems, including:  Heart attack.  Stroke.  Obesity.  Diabetes.  Heart failure.  Irregular heartbeat.  There are three kinds of sleep apnea:  Obstructive sleep apnea. This kind is caused by a blocked or collapsed airway.  Central sleep apnea. This kind happens when the part of the brain that controls breathing does not send the correct signals to the muscles that control breathing.  Mixed sleep apnea. This is a combination of obstructive and central sleep apnea.  What are the causes? The most common cause of this condition is a collapsed or blocked airway. An airway can collapse or become blocked if:  Your throat muscles are abnormally relaxed.  Your tongue and tonsils are larger than normal.  You are overweight.  Your airway is smaller than normal.  What increases the risk? This condition is more likely to develop in people who:  Are overweight.  Smoke.  Have a  smaller than normal airway.  Are elderly.  Are female.  Drink alcohol.  Take sedatives or tranquilizers.  Have a family history of sleep apnea.  What are the signs or symptoms? Symptoms of this condition include:  Trouble staying asleep.  Daytime sleepiness and tiredness.  Irritability.  Loud snoring.  Morning headaches.  Trouble concentrating.  Forgetfulness.  Decreased interest in sex.  Unexplained sleepiness.  Mood swings.  Personality changes.  Feelings of depression.  Waking up often during the night to urinate.  Dry mouth.  Sore throat.  How is this diagnosed? This condition may be diagnosed with:  A medical history.  A physical exam.  A series of tests that are done while you are sleeping (sleep study). These tests are usually done in a sleep lab, but they may also be done at home.  How is this treated? Treatment for this condition aims to restore normal breathing and to ease symptoms during sleep. It may involve managing health issues that can affect breathing, such as high blood pressure or obesity. Treatment may include:  Sleeping on your side.  Using a decongestant if you have nasal congestion.  Avoiding the use of depressants, including alcohol, sedatives, and narcotics.  Losing weight if you are overweight.  Making changes to your diet.  Quitting smoking.  Using a device to open your airway while you sleep, such as: ? An oral appliance. This is a custom-made mouthpiece that shifts your lower jaw forward. ? A continuous positive airway pressure (CPAP)  device. This device delivers oxygen to your airway through a mask. ? A nasal expiratory positive airway pressure (EPAP) device. This device has valves that you put into each nostril. ? A bi-level positive airway pressure (BPAP) device. This device delivers oxygen to your airway through a mask.  Surgery if other treatments do not work. During surgery, excess tissue is removed to create  a wider airway.  It is important to get treatment for sleep apnea. Without treatment, this condition can lead to:  High blood pressure.  Coronary artery disease.  (Men) An inability to achieve or maintain an erection (impotence).  Reduced thinking abilities.  Follow these instructions at home:  Make any lifestyle changes that your health care provider recommends.  Eat a healthy, well-balanced diet.  Take over-the-counter and prescription medicines only as told by your health care provider.  Avoid using depressants, including alcohol, sedatives, and narcotics.  Take steps to lose weight if you are overweight.  If you were given a device to open your airway while you sleep, use it only as told by your health care provider.  Do not use any tobacco products, such as cigarettes, chewing tobacco, and e-cigarettes. If you need help quitting, ask your health care provider.  Keep all follow-up visits as told by your health care provider. This is important. Contact a health care provider if:  The device that you received to open your airway during sleep is uncomfortable or does not seem to be working.  Your symptoms do not improve.  Your symptoms get worse. Get help right away if:  You develop chest pain.  You develop shortness of breath.  You develop discomfort in your back, arms, or stomach.  You have trouble speaking.  You have weakness on one side of your body.  You have drooping in your face. These symptoms may represent a serious problem that is an emergency. Do not wait to see if the symptoms will go away. Get medical help right away. Call your local emergency services (911 in the [place] Do not drive yourself to the hospital. This information is not intended to replace advice given to you by your health care provider. Make sure you discuss any questions you have with your health care provider. Document Released: 05/04/2002 Document Revised: 01/08/2016 Document  Reviewed: 02/21/2015 Elsevier Interactive Patient Education  [employer].

## 2017-07-04 NOTE — Progress Notes (Signed)
I agree with the assessment and plan as directed by NP .The patient is known to me .   Ariely Riddell, MD  

## 2017-07-04 NOTE — Progress Notes (Signed)
I agree with the assessment and plan as directed by NP .The patient is known to me .   Mabry Santarelli, MD  

## 2017-07-04 NOTE — Progress Notes (Signed)
PATIENT: Stacey Graham DOB: [birth year]  REASON FOR VISIT: follow up HISTORY FROM: patient  HISTORY OF PRESENT ILLNESS: Today 07/04/17 Stacey Graham is a 37 year old female with a history of obstructive sleep apnea on CPAP.  She returns today for follow-up.  She reports that she has not been compliant with the CPAP.  She states that she travels at least twice a month.  She reports that she typically packed up the machine to take with her but then will not unpack it when she gets home and therefore she got in the habit of not using it.  She states recently she has began to have the initial symptoms of memory loss and headaches.  She reports that the symptoms have resolved when she was using the CPAP.  She is inquiring if she can have a second CPAP machine that she can use  strictly for traveling to make it more convenient for her use.  She returns today for an evaluation.  HISTORY 03/21/16: Stacey Graham today, an established patient of Dr. Naomie Dean. The patient underwent a split-night polysomnography on 01/01/2016 was diagnosed with an AHI of 31.8 per hour of sleep without significant supine exenteration and without significant REM accentuation. She also did not have periodic limb movements she had some oxygen desaturation and was titrated to a pressure of CPAP at 10 cm water reaching an AHI of 0.0 I prescribed CPAP at 10 cm water with a ResMed air-fluid P 10 which can be exchanged for another interface depending on the patient's liking. She has had some trouble with dislodging the nasal pillow when sleeping on her side.  I'm also able today to see her compliance record it was 93% for use days and 60% for over 4 hours of consecutive use. Her average user time is 4 hours 41 minutes at 10 cm water pressure with 3 cm EPR and her residual AHI is ideal at 0.4 apneas per hour I did see high air leaks in some nights. I like to add that the Epworth score has been decreased from 7-4 points and the  fatigue severity score from 39 to 29.    REVIEW OF SYSTEMS: Out of a complete 14 system review of symptoms, the patient complains only of the following symptoms, and all other reviewed systems are negative.  See HPI  ALLERGIES: No Known Allergies  HOME MEDICATIONS: Outpatient Medications Prior to Visit  Medication Sig Dispense Refill  . Acetaminophen (TYLENOL) 325 MG CAPS Take 2 tablets by mouth as needed.    . Bitter Melon 10 % POWD 1 Dose by Does not apply route.    . Chromium-Cinnamon (CINNAMON PLUS CHROMIUM PO) Take 1 Dose by mouth daily. 2000/400    . levonorgestrel (MIRENA) 20 MCG/24HR IUD 1 each by Intrauterine route once.    Marland Kitchen lisinopril-hydrochlorothiazide (PRINZIDE,ZESTORETIC) 20-12.5 MG tablet Take 1 tablet by mouth daily.  1  . meloxicam (MOBIC) 15 MG tablet Take 15 mg by mouth daily.  1  . Probiotic Product (PROBIOTIC PO) Take 1 Dose by mouth daily. Yeast guard    . SYNJARDY 12.09-998 MG TABS Take 1 tablet by mouth 2 (two) times daily.    Marland Kitchen VITAMIN D, ERGOCALCIFEROL, PO Take 1,000 Units by mouth daily.     No facility-administered medications prior to visit.     PAST MEDICAL HISTORY: Past Medical History:  Diagnosis Date  . Diabetes mellitus (HCC)   . HDL deficiency   . Hypertension   . Morbid obesity (HCC)   .  OA (osteoarthritis) of knee    right    PAST SURGICAL HISTORY: Past Surgical History:  Procedure Laterality Date  . NO PAST SURGERIES      FAMILY HISTORY: Family History  Problem Relation Age of Onset  . Crohn's disease Father   . Hypertension Father   . Hypertension Mother   . Diabetes Mother   . CAD Mother   . Sickle cell anemia Maternal Grandmother   . Migraines Neg Hx   . Dementia Neg Hx   . Stroke Neg Hx   . Multiple sclerosis Neg Hx     SOCIAL HISTORY: Social History   Socioeconomic History  . Marital status: Single    Spouse name: Not on file  . Number of children: 0  . Years of education: 14  . Highest education level: Not  on file  Social Needs  . Financial resource strain: Not on file  . Food insecurity - worry: Not on file  . Food insecurity - inability: Not on file  . Transportation needs - medical: Not on file  . Transportation needs - non-medical: Not on file  Occupational History  . Occupation: Presenter, broadcasting at [employer]  . Smoking status: Never Smoker  . Smokeless tobacco: Never Used  Substance and Sexual Activity  . Alcohol use: Yes    Alcohol/week: 0.0 oz    Comment: 1-2 glasses beer or wine per week  . Drug use: No  . Sexual activity: Not on file  Other Topics Concern  . Not on file  Social History Narrative   Lives alone   Caffeine use: coffee/soda/tea daily (1-2 per day)      PHYSICAL EXAM  Vitals:   07/04/17 1420  BP: 129/74  Pulse: 100  Weight: (!) 394 lb 12.8 oz (179.1 kg)  Height: 5\' 6"  (1.676 m)   Body mass index is 63.72 kg/m.  Generalized: Well developed, in no acute distress   Neurological examination  Mentation: Alert oriented to time, place, history taking. Follows all commands speech and language fluent Cranial nerve II-XII: Pupils were equal round reactive to light. Extraocular movements were full, visual field were full on confrontational test. Facial sensation and strength were normal. Uvula tongue midline. Head turning and shoulder shrug  were normal and symmetric. Motor: The motor testing reveals 5 over 5 strength of all 4 extremities. Good symmetric motor tone is noted throughout.  Sensory: Sensory testing is intact to soft touch on all 4 extremities. No evidence of extinction is noted.  Coordination: Cerebellar testing reveals good finger-nose-finger and heel-to-shin bilaterally.  Gait and station: Gait is normal.   DIAGNOSTIC DATA (LABS, IMAGING, TESTING) - I reviewed patient records, labs, notes, testing and imaging myself where available.  No results found for: WBC, HGB, HCT, MCV, PLT    Component Value Date/Time   CREATININE 1.21  (H) 12/06/2015 1618   GFRNONAA 58 (L) 12/06/2015 1618   GFRAA 67 12/06/2015 1618    Lab Results  Component Value Date   VITAMINB12 589 12/06/2015      ASSESSMENT AND PLAN 37 y.o. year old female  has a past medical history of Diabetes mellitus (HCC), HDL deficiency, Hypertension, Morbid obesity (HCC), and OA (osteoarthritis) of knee. here with :  1. osa on cpap  The patient has not been using her CPAP machine.  I reviewed the risk associated with untreated sleep apnea.  She voiced understanding.  She states that she would like to use the CPAP more consistently and states  that if she had a second machine to use when she traveled this would make it more convenient for her.  She plans to reach out to her DME company to see if she can obtain a second machine.  She is encouraged to restart CPAP use.  She will follow-up in 6 months for another download.   Butch PennyMegan Micheala Morissette, MSN, NP-C 07/04/2017, 12:05 PM [facility] [address], [address] [place], [place] [address] [phone]

## 2017-12-20 ENCOUNTER — Encounter: Payer: Self-pay | Admitting: Adult Health

## 2018-01-06 ENCOUNTER — Ambulatory Visit: Payer: BC Managed Care – PPO | Admitting: Adult Health

## 2018-01-06 ENCOUNTER — Encounter: Payer: Self-pay | Admitting: Adult Health

## 2018-01-06 VITALS — BP 130/84 | HR 83 | Ht 66.0 in | Wt 378.0 lb

## 2018-01-06 DIAGNOSIS — Z9989 Dependence on other enabling machines and devices: Secondary | ICD-10-CM | POA: Diagnosis not present

## 2018-01-06 DIAGNOSIS — G4733 Obstructive sleep apnea (adult) (pediatric): Secondary | ICD-10-CM

## 2018-01-06 NOTE — Progress Notes (Signed)
PATIENT: Scheryl Darter DOB: 08/14/1980  REASON FOR VISIT: follow up HISTORY FROM: patient  HISTORY OF PRESENT ILLNESS: Today 01/06/18:  Ms. Halliday is a 37 year old female with a history of obstructive sleep apnea on CPAP.  She returns today for follow-up.  Her CPAP download is from November 21, 2017 to December 20, 2017.  She used her machine only 8 out of 30 days for compliance of 27%.  Of those 8 days she  used her machine greater than 4 hours only 4 days.  On average she uses machine 4 hours and 51 minutes.  Her pressures at 10.  Her residual AHI is 0.6.  She does not have a significant leak.  She states that she she has had a hard time getting in the habit of using the machine.  She reports that she travels frequently and often when she returns home she does not unpack her machine therefore she does not need it.  She returns today for evaluation.  HISTORY 07/04/17 Ms. Gielow is a 37 year old female with a history of obstructive sleep apnea on CPAP.  She returns today for follow-up.  She reports that she has not been compliant with the CPAP.  She states that she travels at least twice a month.  She reports that she typically packed up the machine to take with her but then will not unpack it when she gets home and therefore she got in the habit of not using it.  She states recently she has began to have the initial symptoms of memory loss and headaches.  She reports that the symptoms have resolved when she was using the CPAP.  She is inquiring if she can have a second CPAP machine that she can use  strictly for traveling to make it more convenient for her use.  She returns today for an evaluation.   REVIEW OF SYSTEMS: Out of a complete 14 system review of symptoms, the patient complains only of the following symptoms, and all other reviewed systems are negative.  See HPI  ALLERGIES: No Known Allergies  HOME MEDICATIONS: Outpatient Medications Prior to Visit  Medication Sig Dispense Refill    . Acetaminophen (TYLENOL) 325 MG CAPS Take 2 tablets by mouth as needed.    . Bitter Melon 10 % POWD 1 Dose by Does not apply route.    . Chromium-Cinnamon (CINNAMON PLUS CHROMIUM PO) Take 1 Dose by mouth daily. 2000/400    . Dulaglutide (TRULICITY) 0.75 MG/0.5ML SOPN Inject into the skin. Once weekly injection    . levonorgestrel (MIRENA) 20 MCG/24HR IUD 1 each by Intrauterine route once.    Marland Kitchen lisinopril-hydrochlorothiazide (PRINZIDE,ZESTORETIC) 20-12.5 MG tablet Take 1 tablet by mouth daily.  1  . meloxicam (MOBIC) 15 MG tablet Take 15 mg by mouth daily.  1  . Probiotic Product (PROBIOTIC PO) Take 1 Dose by mouth daily. Yeast guard    . SYNJARDY 12.09-998 MG TABS Take 1 tablet by mouth 2 (two) times daily.    Marland Kitchen VITAMIN D, ERGOCALCIFEROL, PO Take 1,000 Units by mouth daily.     No facility-administered medications prior to visit.     PAST MEDICAL HISTORY: Past Medical History:  Diagnosis Date  . Diabetes mellitus (HCC)   . HDL deficiency   . Hypertension   . Morbid obesity (HCC)   . OA (osteoarthritis) of knee    right    PAST SURGICAL HISTORY: Past Surgical History:  Procedure Laterality Date  . NO PAST SURGERIES  FAMILY HISTORY: Family History  Problem Relation Age of Onset  . Crohn's disease Father   . Hypertension Father   . Hypertension Mother   . Diabetes Mother   . CAD Mother   . Sickle cell anemia Maternal Grandmother   . Migraines Neg Hx   . Dementia Neg Hx   . Stroke Neg Hx   . Multiple sclerosis Neg Hx     SOCIAL HISTORY: Social History   Socioeconomic History  . Marital status: Single    Spouse name: Not on file  . Number of children: 0  . Years of education: 6316  . Highest education level: Not on file  Occupational History  . 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 Financial resource strain: Not on file  . Food insecurity:    Worry: Not on file    Inability: Not on file  . Transportation needs:    Medical: Not on  file    Non-medical: Not on file  Tobacco Use  . Smoking status: Never Smoker  . Smokeless tobacco: Never Used  Substance and Sexual Activity  . Alcohol use: Yes    Alcohol/week: 0.0 standard drinks    Comment: 1-2 glasses beer or wine per week  . Drug use: No  . Sexual activity: Not on file  Lifestyle  . Physical activity:    Days per week: Not on file    Minutes per session: Not on file  . Stress: Not on file  Relationships  . Social connections:    Talks on phone: Not on file    Gets together: Not on file    Attends religious service: Not on file    Active member of club or organization: Not on file    Attends meetings of clubs or organizations: Not on file    Relationship status: Not on file  . Intimate partner violence:    Fear of current or ex partner: Not on file    Emotionally abused: Not on file    Physically abused: Not on file    Forced sexual activity: Not on file  Other Topics Concern  . Not on file  Social History Narrative   Lives alone   Caffeine use: coffee/soda/tea daily (1-2 per day)      PHYSICAL EXAM  Vitals:   01/06/18 1402  BP: 130/84  Pulse: 83  Weight: (!) 378 lb (171.5 kg)  Height: 5\' 6"  (1.676 m)   Body mass index is 61.01 kg/m.  Generalized: Well developed, in no acute distress   Neurological examination  Mentation: Alert oriented to time, place, history taking. Follows all commands speech and language fluent Cranial nerve II-XII: Pupils were equal round reactive to light. Extraocular movements were full, visual field were full on confrontational test. Facial sensation and strength were normal. Uvula tongue midline. Head turning and shoulder shrug  were normal and symmetric.  Neck circumference 18-1/2 inches Motor: The motor testing reveals 5 over 5 strength of all 4 extremities. Good symmetric motor tone is noted throughout.  Sensory: Sensory testing is intact to soft touch on all 4 extremities. No evidence of extinction is noted.   Coordination: Cerebellar testing reveals good finger-nose-finger and heel-to-shin bilaterally.  Gait and station: Gait is normal.    DIAGNOSTIC DATA (LABS, IMAGING, TESTING) - I reviewed patient records, labs, notes, testing and imaging myself where available.     ASSESSMENT AND PLAN 37 y.o. year old female  has a past medical history of Diabetes mellitus (  HCC), HDL deficiency, Hypertension, Morbid obesity (HCC), and OA (osteoarthritis) of knee. here with:  1.  Obstructive sleep apnea on CPAP  The patient CPAP download indicates noncompliance.  She is encouraged to use the machine nightly and greater than 4 hours each night.  I did try to encourage the patient to be more consistent  with using the CPAP as she will then begin to see the benefit.  She voiced understanding.  I also reviewed risk of untreated sleep apnea.  She states that she will try to get in the habit of using machine nightly.  She will return in 6 months for another download.  I spent 15 minutes with the patient. 50% of this time was spent reviewing her CPAP download   Butch PennyMegan Malissia Rabbani, MSN, NP-C 01/06/2018, 2:10 PM [facility] [address], [address] [place], [place] [address] [phone]

## 2018-01-06 NOTE — Patient Instructions (Signed)
Your Plan:  Use CPAP nightly and >4 hours each night If your symptoms worsen or you develop new symptoms please let us know.    Thank you for coming [facility] at [facility]. I hope we have been able to provide you high quality care today.  You may receive a patient satisfaction survey over the next few weeks. We would appreciate your feedback and comments so that we may continue to improve ourselves and the health of our patients.

## 2018-01-14 ENCOUNTER — Ambulatory Visit: Payer: BC Managed Care – PPO | Admitting: Adult Health

## 2018-01-29 NOTE — Progress Notes (Signed)
I agree with the assessment and plan as directed by NP .The patient is known to me .   Robie Oats, MD  

## 2018-02-03 ENCOUNTER — Other Ambulatory Visit: Payer: Self-pay | Admitting: Family Medicine

## 2018-02-03 ENCOUNTER — Other Ambulatory Visit (HOSPITAL_COMMUNITY)
Admission: RE | Admit: 2018-02-03 | Discharge: 2018-02-03 | Disposition: A | Discharge status: Home / Self Care | Payer: BC Managed Care – PPO | Source: Ambulatory Visit | Attending: Family Medicine | Admitting: Family Medicine

## 2018-02-03 DIAGNOSIS — Z0000 Encounter for general adult medical examination without abnormal findings: Secondary | ICD-10-CM | POA: Insufficient documentation

## 2018-02-06 LAB — CYTOLOGY - PAP
Chlamydia: NEGATIVE
Diagnosis: UNDETERMINED — AB
HPV 16/18/45 genotyping: NEGATIVE
HPV: DETECTED — AB
Neisseria Gonorrhea: NEGATIVE
Trichomonas: NEGATIVE

## 2018-03-26 ENCOUNTER — Other Ambulatory Visit: Payer: Self-pay | Admitting: Obstetrics and Gynecology

## 2018-04-14 ENCOUNTER — Other Ambulatory Visit: Payer: Self-pay | Admitting: Obstetrics and Gynecology

## 2018-07-09 ENCOUNTER — Ambulatory Visit: Payer: BC Managed Care – PPO | Admitting: Neurology

## 2018-10-30 ENCOUNTER — Other Ambulatory Visit: Payer: Self-pay | Admitting: Obstetrics and Gynecology

## 2018-10-30 ENCOUNTER — Other Ambulatory Visit (HOSPITAL_COMMUNITY)
Admission: RE | Admit: 2018-10-30 | Discharge: 2018-10-30 | Disposition: A | Discharge status: Home / Self Care | Payer: BC Managed Care – PPO | Source: Ambulatory Visit | Attending: Obstetrics and Gynecology | Admitting: Obstetrics and Gynecology

## 2018-10-30 DIAGNOSIS — Z124 Encounter for screening for malignant neoplasm of cervix: Secondary | ICD-10-CM | POA: Insufficient documentation

## 2018-11-04 LAB — CYTOLOGY - PAP
Adequacy: ABSENT
Diagnosis: NEGATIVE
HPV: NOT DETECTED

## 2019-02-16 ENCOUNTER — Other Ambulatory Visit: Payer: Self-pay

## 2019-02-16 ENCOUNTER — Ambulatory Visit: Payer: BC Managed Care – PPO | Admitting: Family Medicine

## 2019-02-16 ENCOUNTER — Encounter: Payer: Self-pay | Admitting: Family Medicine

## 2019-02-16 VITALS — BP 124/70 | HR 79 | Temp 97.1°F | Ht 66.0 in | Wt 384.0 lb

## 2019-02-16 DIAGNOSIS — Z9989 Dependence on other enabling machines and devices: Secondary | ICD-10-CM

## 2019-02-16 DIAGNOSIS — G4733 Obstructive sleep apnea (adult) (pediatric): Secondary | ICD-10-CM | POA: Diagnosis not present

## 2019-02-16 NOTE — Progress Notes (Addendum)
PATIENT: Stacey Graham DOB: May 30, 1980  REASON FOR VISIT: follow up HISTORY FROM: patient  Chief Complaint  Patient presents with   Follow-up   cpap     HISTORY OF PRESENT ILLNESS: Today 02/16/19 Stacey Graham is a 38 y.o. female here today for follow up of OSA on CPAP.  She admits that she has not been compliant with therapy.  She blames this on traveling.  She will often take it with her on weekend trips but states that she does not unpack it during the week.  She does note significant benefit in how she feels when she uses CPAP therapy.  Compliance report dated 01/03/2019 through 02/01/2019 reveals that she is using CPAP 30 out of the last 30 days for compliance of 10%.  The 3 days used that she did use CPAP greater than 4 hours for compliance of 10%.  Average usage was 7 hours and 57 minutes.  AHI was 0.5 on 10 cm of water and EPR of 3.  There was no significant leak noted.  HISTORY: (copied from [place] note on 01/06/2018)  Stacey Graham is a 38 year old female with a history of obstructive sleep apnea on CPAP.  She returns today for follow-up.  Her CPAP download is from November 21, 2017 to December 20, 2017.  She used her machine only 8 out of 30 days for compliance of 27%.  Of those 8 days she  used her machine greater than 4 hours only 4 days.  On average she uses machine 4 hours and 51 minutes.  Her pressures at 10.  Her residual AHI is 0.6.  She does not have a significant leak.  She states that she she has had a hard time getting in the habit of using the machine.  She reports that she travels frequently and often when she returns home she does not unpack her machine therefore she does not need it.  She returns today for evaluation.  HISTORY 07/04/17 Stacey Graham is a 38 year old female with a history of obstructive sleep apnea on CPAP. She returns today for follow-up. She reports that she has not been compliant with the CPAP. She states that she travels at least twice a month.  She reports that she typically packed up the machine to take with her but then will not unpack it when she gets home and therefore she got in the habit of not using it. She states recently she has began to have the initial symptoms of memory loss and headaches. She reports that the symptoms have resolved when she was using the CPAP. She is inquiring if she can have a second CPAP machine that she can use strictly for traveling to make it more convenient for her use. She returns today for an evaluation.  REVIEW OF SYSTEMS: Out of a complete 14 system review of symptoms, the patient complains only of the following symptoms, headaches, depression, daytime sleepiness and all other reviewed systems are negative.  Epworth sleepiness scale: 7 Fatigue severity scale: 51    ALLERGIES: No Known Allergies  HOME MEDICATIONS: Outpatient Medications Prior to Visit  Medication Sig Dispense Refill   Acetaminophen (TYLENOL) 325 MG CAPS Take 2 tablets by mouth as needed.     Bitter Melon 10 % POWD 1 Dose by Does not apply route.     Chromium-Cinnamon (CINNAMON PLUS CHROMIUM PO) Take 1 Dose by mouth daily. 2000/400     Continuous Blood Gluc Sensor (FREESTYLE LIBRE 14 DAY SENSOR) MISC CHANGE EVERY 2  WEEKS     Dulaglutide (TRULICITY) 0.75 MG/0.5ML SOPN Inject into the skin. Once weekly injection     FLUoxetine (PROZAC) 10 MG capsule Take by mouth daily.     levonorgestrel (MIRENA) 20 MCG/24HR IUD 1 each by Intrauterine route once.     lisinopril-hydrochlorothiazide (PRINZIDE,ZESTORETIC) 20-12.5 MG tablet Take 1 tablet by mouth daily.  1   meloxicam (MOBIC) 15 MG tablet Take 15 mg by mouth daily.  1   Probiotic Product (PROBIOTIC PO) Take 1 Dose by mouth daily. Yeast guard     SYNJARDY 12.09-998 MG TABS Take 1 tablet by mouth 2 (two) times daily.     VITAMIN D, ERGOCALCIFEROL, PO Take 1,000 Units by mouth daily.     No facility-administered medications prior to visit.     PAST MEDICAL  HISTORY: Past Medical History:  Diagnosis Date   Diabetes mellitus (HCC)    HDL deficiency    Hypertension    Morbid obesity (HCC)    OA (osteoarthritis) of knee    right    PAST SURGICAL HISTORY: Past Surgical History:  Procedure Laterality Date   NO PAST SURGERIES      FAMILY HISTORY: Family History  Problem Relation Age of Onset   Crohn's disease Father    Hypertension Father    Hypertension Mother    Diabetes Mother    CAD Mother    Sickle cell anemia Maternal Grandmother    Migraines Neg Hx    Dementia Neg Hx    Stroke Neg Hx    Multiple sclerosis Neg Hx     SOCIAL HISTORY: Social History   Socioeconomic History   Marital status: Single    Spouse name: Not on file   Number of children: 0   Years of education: 16   Highest education level: Not on file  Occupational History   Occupation: Presenter, 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 [employer]  Social Needs   Financial resource strain: Not on file   Food insecurity    Worry: Not on file    Inability: Not on file   Transportation needs    Medical: Not on file    Non-medical: Not on file  Tobacco Use   Smoking status: Never Smoker   Smokeless tobacco: Never Used  Substance and Sexual Activity   Alcohol use: Yes    Alcohol/week: 0.0 standard drinks    Comment: 1-2 glasses beer or wine per week   Drug use: No   Sexual activity: Not on file  Lifestyle   Physical activity    Days per week: Not on file    Minutes per session: Not on file   Stress: Not on file  Relationships   Social connections    Talks on phone: Not on file    Gets together: Not on file    Attends religious service: Not on file    Active member of club or organization: Not on file    Attends meetings of clubs or organizations: Not on file    Relationship status: Not on file   Intimate partner violence    Fear of current or ex partner: Not on file    Emotionally abused: Not on file    Physically abused: Not on file     Forced sexual activity: Not on file  Other Topics Concern   Not on file  Social History Narrative   Lives alone   Caffeine use: coffee/soda/tea daily (1-2 per day)      PHYSICAL EXAM  Vitals:   02/16/19  1451  BP: 124/70  Pulse: 79  Temp: (!) 97.1 F (36.2 C)  SpO2: 97%  Weight: (!) 384 lb (174.2 kg)  Height: 5\' 6"  (1.676 m)   Body mass index is 61.98 kg/m.  Generalized: Well developed, in no acute distress  Cardiology: normal rate and rhythm, no murmur noted : Clear to auscultation bilaterally Mallampati 4+, neck circ 18" Neurological examination  Mentation: Alert oriented to time, place, history taking. Follows all commands speech and language fluent Cranial nerve II-XII: Pupils were equal round reactive to light. Extraocular movements were full, visual field were full on confrontational test. Facial sensation and strength were normal. Uvula tongue midline. Head turning and shoulder shrug  were normal and symmetric. Motor: The motor testing reveals 5 over 5 strength of all 4 extremities. Good symmetric motor tone is noted throughout.  Sensory: Sensory testing is intact to soft touch on all 4 extremities. No evidence of extinction is noted.  Coordination: Cerebellar testing reveals good finger-nose-finger and heel-to-shin bilaterally.  Gait and station: Gait is normal.   DIAGNOSTIC DATA (LABS, IMAGING, TESTING) - I reviewed patient records, labs, notes, testing and imaging myself where available.  No flowsheet data found.   No results found for: WBC, HGB, HCT, MCV, PLT    Component Value Date/Time   CREATININE 1.21 (H) 12/06/2015 1618   GFRNONAA 58 (L) 12/06/2015 1618   GFRAA 67 12/06/2015 1618   No results found for: CHOL, HDL, LDLCALC, LDLDIRECT, TRIG, CHOLHDL No results found for: [id number] Lab Results  Component Value Date   VITAMINB12 589 12/06/2015   No results found for: TSH     ASSESSMENT AND PLAN 38 y.o. year old female  has a past medical history  of Diabetes mellitus (HCC), HDL deficiency, Hypertension, Morbid obesity (HCC), and OA (osteoarthritis) of knee. here with     ICD-10-CM   1. OSA on CPAP  G47.33 For home use only DME continuous positive airway pressure (CPAP)   Z99.89     Amonie admits that she has not been compliant with therapy.  She does expressed interest in continuing to work on compliance.  I will order mask refitting and supplies today.  She is interested in trying a fullface mask.  She was encouraged to use CPAP therapy nightly and for greater than 4 hours each night.  We have discussed risk of untreated sleep apnea.  She will follow-up with me in 3 months.  She verbalizes understanding and agreement with this plan.   Orders Placed This Encounter  Procedures   For home use only DME continuous positive airway pressure (CPAP)    Mask refitting and supplies please    Order Specific Question:   Length of Need    Answer:   Lifetime    Order Specific Question:   Patient has OSA or probable OSA    Answer:   Yes    Order Specific Question:   Is the patient currently using CPAP in the home    Answer:   Yes    Order Specific Question:   Settings    Answer:   Other see comments    Order Specific Question:   CPAP supplies needed    Answer:   Mask, headgear, cushions, filters, heated tubing and water chamber     No orders of the defined types were placed in this encounter.     I spent 15 minutes with the patient. 50% of this time was spent counseling and educating patient on plan of care  and medications.    Shawnie Dapper, FNP-C 02/16/2019, 4:37 PM [facility] [address], [address] [place], [place] [phone]

## 2019-02-16 NOTE — Patient Instructions (Signed)
Use CPAP nightly and for greater than 4 hours each night  Follow up in 3 months for compliance report   Sleep Apnea Sleep apnea affects breathing during sleep. It causes breathing to stop for a short time or to become shallow. It can also increase the risk of:  Heart attack.  Stroke.  Being very overweight (obese).  Diabetes.  Heart failure.  Irregular heartbeat. The goal of treatment is to help you breathe normally again. What are the causes? There are three kinds of sleep apnea:  Obstructive sleep apnea. This is caused by a blocked or collapsed airway.  Central sleep apnea. This happens when the brain does not send the right signals to the muscles that control breathing.  Mixed sleep apnea. This is a combination of obstructive and central sleep apnea. The most common cause of this condition is a collapsed or blocked airway. This can happen if:  Your throat muscles are too relaxed.  Your tongue and tonsils are too large.  You are overweight.  Your airway is too small. What increases the risk?  Being overweight.  Smoking.  Having a small airway.  Being older.  Being female.  Drinking alcohol.  Taking medicines to calm yourself (sedatives or tranquilizers).  Having family members with the condition. What are the signs or symptoms?  Trouble staying asleep.  Being sleepy or tired during the day.  Getting angry a lot.  Loud snoring.  Headaches in the morning.  Not being able to focus your mind (concentrate).  Forgetting things.  Less interest in sex.  Mood swings.  Personality changes.  Feelings of sadness (depression).  Waking up a lot during the night to pee (urinate).  Dry mouth.  Sore throat. How is this diagnosed?  Your medical history.  A physical exam.  A test that is done when you are sleeping (sleep study). The test is most often done in a sleep lab but may also be done at home. How is this treated?   Sleeping on your  side.  Using a medicine to get rid of mucus in your nose (decongestant).  Avoiding the use of alcohol, medicines to help you relax, or certain pain medicines (narcotics).  Losing weight, if needed.  Changing your diet.  Not smoking.  Using a machine to open your airway while you sleep, such as: ? An oral appliance. This is a mouthpiece that shifts your lower jaw forward. ? A CPAP device. This device blows air through a mask when you breathe out (exhale). ? An EPAP device. This has valves that you put in each nostril. ? A BPAP device. This device blows air through a mask when you breathe in (inhale) and breathe out.  Having surgery if other treatments do not work. It is important to get treatment for sleep apnea. Without treatment, it can lead to:  High blood pressure.  Coronary artery disease.  In men, not being able to have an erection (impotence).  Reduced thinking ability. Follow these instructions at home: Lifestyle  Make changes that your doctor recommends.  Eat a healthy diet.  Lose weight if needed.  Avoid alcohol, medicines to help you relax, and some pain medicines.  Do not use any products that contain nicotine or tobacco, such as cigarettes, e-cigarettes, and chewing tobacco. If you need help quitting, ask your doctor. General instructions  Take over-the-counter and prescription medicines only as told by your doctor.  If you were given a machine to use while you sleep, use it only  as told by your doctor.  If you are having surgery, make sure to tell your doctor you have sleep apnea. You may need to bring your device with you.  Keep all follow-up visits as told by your doctor. This is important. Contact a doctor if:  The machine that you were given to use during sleep bothers you or does not seem to be working.  You do not get better.  You get worse. Get help right away if:  Your chest hurts.  You have trouble breathing in enough air.  You have  an uncomfortable feeling in your back, arms, or stomach.  You have trouble talking.  One side of your body feels weak.  A part of your face is hanging down. These symptoms may be an emergency. Do not wait to see if the symptoms will go away. Get medical help right away. Call your local emergency services (911 in the [place] Do not drive yourself to the hospital. Summary  This condition affects breathing during sleep.  The most common cause is a collapsed or blocked airway.  The goal of treatment is to help you breathe normally while you sleep. This information is not intended to replace advice given to you by your health care provider. Make sure you discuss any questions you have with your health care provider. Document Released: 02/21/2008 Document Revised: 02/28/2018 Document Reviewed: 01/07/2018 Elsevier Patient Education  2020 [employer]  

## 2019-05-11 ENCOUNTER — Ambulatory Visit: Payer: BC Managed Care – PPO | Admitting: Family Medicine

## 2019-06-04 ENCOUNTER — Encounter: Payer: Self-pay | Admitting: Family Medicine

## 2019-06-04 ENCOUNTER — Telehealth (INDEPENDENT_AMBULATORY_CARE_PROVIDER_SITE_OTHER): Payer: BC Managed Care – PPO | Admitting: Family Medicine

## 2019-06-04 DIAGNOSIS — G4733 Obstructive sleep apnea (adult) (pediatric): Secondary | ICD-10-CM | POA: Diagnosis not present

## 2019-06-04 DIAGNOSIS — Z9989 Dependence on other enabling machines and devices: Secondary | ICD-10-CM

## 2019-06-04 NOTE — Progress Notes (Signed)
   PATIENT: Stacey Graham DOB: 01-25-1981  REASON FOR VISIT: follow up HISTORY FROM: patient  Virtual Visit via Telephone Note  I connected with Stacey Graham on 06/04/19 at  9:30 AM EST by telephone and verified that I am speaking with the correct person using two identifiers.   I discussed the limitations, risks, security and privacy concerns of performing an evaluation and management service by telephone and the availability of in person appointments. I also discussed with the patient that there may be a patient responsible charge related to this service. The patient expressed understanding and agreed to proceed.   History of Present Illness:  06/04/19 Stacey Graham is a 39 y.o. female here today for follow up.  She admits that she has continued to struggle with compliance with CPAP.  She states that she just cannot get herself back in the habit of using the device.  She often falls asleep before remembering to place CPAP.  She denies any difficulty with CPAP eyes.  Compliance report dated 03/24/2019 through 05/22/2019 reveals that she used CPAP 3 of the last 60 days for compliance of 5%.  She did use CPAP greater than 4 hours those 3 days.  Residual AHI was 0.2 on 10 cm of water and an EPR of 3.  There was no significant leak.   Observations/Objective: Not performed due to nature of televisit.   Assessment and Plan:  39 y.o. year old female  has a past medical history of Diabetes mellitus (HCC), HDL deficiency, Hypertension, Morbid obesity (HCC), and OA (osteoarthritis) of knee. here with    ICD-10-CM   1. OSA on CPAP  G47.33    Z99.89     I had a long conversation with Ms. Gruetzmacher regarding need for consistency with CPAP usage.  She has been noncompliant for most of this year.  We have reviewed risk of untreated sleep apnea.  She is very motivated to continue use.  She was encouraged to use CPAP every night and greater than 4 hours each night.  I have asked that she follow-up  with me in 3 months to assess compliance.  She will call with any concerns.  She verbalizes understanding and agreement with this plan.  No orders of the defined types were placed in this encounter.   No orders of the defined types were placed in this encounter.    Follow Up Instructions:  I discussed the assessment and treatment plan with the patient. The patient was provided an opportunity to ask questions and all were answered. The patient agreed with the plan and demonstrated an understanding of the instructions.   The patient was advised to call back or seek an in-person evaluation if the symptoms worsen or if the condition fails to improve as anticipated.  I provided 20 minutes of non-face-to-face time during this encounter.  Patient is located at her place of residence during my chart visit.  Visit was converted to televisit due to technological difficulty.  Provider is in office.   Shawnie Dapper, NP

## 2019-09-03 ENCOUNTER — Other Ambulatory Visit: Payer: Self-pay | Admitting: Obstetrics and Gynecology

## 2019-09-03 ENCOUNTER — Other Ambulatory Visit: Payer: Self-pay

## 2019-09-03 ENCOUNTER — Other Ambulatory Visit: Payer: Self-pay | Admitting: Nephrology

## 2019-09-11 ENCOUNTER — Other Ambulatory Visit: Payer: Self-pay | Admitting: Obstetrics and Gynecology

## 2019-09-11 DIAGNOSIS — N920 Excessive and frequent menstruation with regular cycle: Secondary | ICD-10-CM

## 2019-09-11 DIAGNOSIS — T8332XA Displacement of intrauterine contraceptive device, initial encounter: Secondary | ICD-10-CM

## 2019-09-17 ENCOUNTER — Ambulatory Visit
Admission: RE | Admit: 2019-09-17 | Discharge: 2019-09-17 | Disposition: A | Discharge status: Home / Self Care | Payer: BC Managed Care – PPO | Source: Ambulatory Visit | Attending: Obstetrics and Gynecology | Admitting: Obstetrics and Gynecology

## 2019-09-17 DIAGNOSIS — N920 Excessive and frequent menstruation with regular cycle: Secondary | ICD-10-CM

## 2019-09-17 DIAGNOSIS — T8332XA Displacement of intrauterine contraceptive device, initial encounter: Secondary | ICD-10-CM

## 2019-12-08 NOTE — Progress Notes (Signed)
[facility] Dr. Varnado for procedure orders. 

## 2019-12-08 NOTE — Pre-Procedure Instructions (Signed)
[facility] [phone] [place], [place] - [address] [place] [address] Phone: [phone] Fax: [phone]     Your procedure is scheduled on Wednesday, July 21 from 08:30 AM- 09:52 AM.  Report to [facility] Main Entrance "A" at 06:30 A.M., and check in at the Admitting office.  Call this number if you have problems the morning of surgery:  [phone]  Call [phone] if you have any questions prior to your surgery date Monday-Friday 8am-4pm    Remember:  Do not eat after midnight the night before your surgery.  You may drink clear liquids until 05:30 AM the morning of your surgery.   Clear liquids allowed are: Water, Non-Citrus Juices (without pulp), Carbonated Beverages, Clear Tea, Black Coffee Only, and Gatorade.    Take these medicines the morning of surgery with A SIP OF WATER: atorvastatin (LIPITOR) buPROPion (WELLBUTRIN XL)  FLUoxetine (PROZAC)  IF NEEDED: acetaminophen (TYLENOL)   As of today, STOP taking any Aspirin (unless otherwise instructed by your surgeon), meloxicam (MOBIC), Aleve, Naproxen, Ibuprofen, Motrin, Advil, Goody's, BC's, all herbal medications, fish oil, and all vitamins.   WHAT DO I DO ABOUT MY DIABETES MEDICATION?  . THE DAY BEFORE SURGERY, do not take SYNJARDY.      . THE MORNING OF SURGERY, do not take SYNJARDY or Dulaglutide (TRULICITY).   HOW TO MANAGE YOUR DIABETES BEFORE AND AFTER SURGERY  Why is it important to control my blood sugar before and after surgery? . Improving blood sugar levels before and after surgery helps healing and can limit problems. . A way of improving blood sugar control is eating a healthy diet by: o  Eating less sugar and carbohydrates o  Increasing activity/exercise o  Talking with your doctor about reaching your blood sugar goals . High blood sugars (greater than 180 mg/dL) can raise your risk of infections and slow your recovery, so you will need to focus on controlling  your diabetes during the weeks before surgery. . Make sure that the doctor who takes care of your diabetes knows about your planned surgery including the date and location.  How do I manage my blood sugar before surgery? . Check your blood sugar at least 4 times a day, starting 2 days before surgery, to make sure that the level is not too high or low. . Check your blood sugar the morning of your surgery when you wake up and every 2 hours until you get to the Short Stay unit. o If your blood sugar is less than 70 mg/dL, you will need to treat for low blood sugar: - Do not take insulin. - Treat a low blood sugar (less than 70 mg/dL) with  cup of clear juice (cranberry or apple), 4 glucose tablets, OR glucose gel. - Recheck blood sugar in 15 minutes after treatment (to make sure it is greater than 70 mg/dL). If your blood sugar is not greater than 70 mg/dL on recheck, call [phone] for further instructions. . Report your blood sugar to the short stay nurse when you get to Short Stay.  . If you are admitted to the hospital after surgery: o Your blood sugar will be checked by the staff and you will probably be given insulin after surgery (instead of oral diabetes medicines) to make sure you have good blood sugar levels. o The goal for blood sugar control after surgery is 80-180 mg/dL.         The Morning of Surgery:  Do not wear jewelry, make up, or nail polish.            Do not wear lotions, powders, perfumes, or deodorant.            Do not shave 48 hours prior to surgery.             Do not bring valuables to the hospital.            [facility] is not responsible for any belongings or valuables.  Do NOT Smoke (Tobacco/Vaping) or drink Alcohol 24 hours prior to your procedure. If you use a CPAP at night, you may bring all equipment for your overnight stay.   Contacts, glasses, dentures or bridgework may not be worn into surgery.      For patients admitted to the hospital,  discharge time will be determined by your treatment team.   Patients discharged the day of surgery will not be allowed to drive home, and someone needs to stay with them for 24 hours.    Special instructions:   [facility]- Preparing For Surgery  Before surgery, you can play an important role. Because skin is not sterile, your skin needs to be as free of germs as possible. You can reduce the number of germs on your skin by washing with CHG (chlorahexidine gluconate) Soap before surgery.  CHG is an antiseptic cleaner which kills germs and bonds with the skin to continue killing germs even after washing.    Oral Hygiene is also important to reduce your risk of infection.  Remember - BRUSH YOUR TEETH THE MORNING OF SURGERY WITH YOUR REGULAR TOOTHPASTE  Please do not use if you have an allergy to CHG or antibacterial soaps. If your skin becomes reddened/irritated stop using the CHG.  Do not shave (including legs and underarms) for at least 48 hours prior to first CHG shower. It is OK to shave your face.  Please follow these instructions carefully.   1. Shower the NIGHT BEFORE SURGERY and the MORNING OF SURGERY with CHG Soap.   2. If you chose to wash your hair, wash your hair first as usual with your normal shampoo.  3. After you shampoo, rinse your hair and body thoroughly to remove the shampoo.  4. Use CHG as you would any other liquid soap. You can apply CHG directly to the skin and wash gently with a scrungie or a clean washcloth.   5. Apply the CHG Soap to your body ONLY FROM THE NECK DOWN.  Do not use on open wounds or open sores. Avoid contact with your eyes, ears, mouth and genitals (private parts). Wash Face and genitals (private parts)  with your normal soap.   6. Wash thoroughly, paying special attention to the area where your surgery will be performed.  7. Thoroughly rinse your body with warm water from the neck down.  8. DO NOT shower/wash with your normal soap after using  and rinsing off the CHG Soap.  9. Pat yourself dry with a CLEAN TOWEL.  10. Wear CLEAN PAJAMAS to bed the night before surgery  11. Place CLEAN SHEETS on your bed the night of your first shower and DO NOT SLEEP WITH PETS.   Day of Surgery: Wear Clean/Comfortable clothing the morning of surgery Do not apply any deodorants/lotions.   Remember to brush your teeth WITH YOUR REGULAR TOOTHPASTE.   Please read over the following fact sheets that you were given.

## 2019-12-09 ENCOUNTER — Encounter (HOSPITAL_COMMUNITY)
Admission: RE | Admit: 2019-12-09 | Discharge: 2019-12-09 | Disposition: A | Discharge status: Home / Self Care | Payer: BC Managed Care – PPO | Source: Ambulatory Visit | Attending: Obstetrics and Gynecology | Admitting: Obstetrics and Gynecology

## 2019-12-09 ENCOUNTER — Other Ambulatory Visit (HOSPITAL_COMMUNITY): Payer: BC Managed Care – PPO

## 2019-12-09 ENCOUNTER — Other Ambulatory Visit: Payer: Self-pay

## 2019-12-09 ENCOUNTER — Encounter (HOSPITAL_COMMUNITY): Payer: Self-pay

## 2019-12-09 DIAGNOSIS — Z01818 Encounter for other preprocedural examination: Secondary | ICD-10-CM | POA: Insufficient documentation

## 2019-12-09 HISTORY — DX: Sleep apnea, unspecified: G47.30

## 2019-12-09 HISTORY — DX: Depression, unspecified: F32.A

## 2019-12-09 LAB — CBC
HCT: 44.3 % (ref 36.0–46.0)
Hemoglobin: 13.8 g/dL (ref 12.0–15.0)
MCH: 24.7 pg — ABNORMAL LOW (ref 26.0–34.0)
MCHC: 31.2 g/dL (ref 30.0–36.0)
MCV: 79.4 fL — ABNORMAL LOW (ref 80.0–100.0)
Platelets: 273 10*3/uL (ref 150–400)
RBC: 5.58 MIL/uL — ABNORMAL HIGH (ref 3.87–5.11)
RDW: 15.4 % (ref 11.5–15.5)
WBC: 7 10*3/uL (ref 4.0–10.5)
nRBC: 0 % (ref 0.0–0.2)

## 2019-12-09 LAB — BASIC METABOLIC PANEL
Anion gap: 11 (ref 5–15)
BUN: 11 mg/dL (ref 6–20)
CO2: 23 mmol/L (ref 22–32)
Calcium: 9.4 mg/dL (ref 8.9–10.3)
Chloride: 104 mmol/L (ref 98–111)
Creatinine, Ser: 0.93 mg/dL (ref 0.44–1.00)
GFR calc Af Amer: >60 mL/min (ref >60)
GFR calc non Af Amer: >60 mL/min (ref >60)
Glucose, Bld: 136 mg/dL — ABNORMAL HIGH (ref 70–99)
Potassium: 4.2 mmol/L (ref 3.5–5.1)
Sodium: 138 mmol/L (ref 135–145)

## 2019-12-09 LAB — GLUCOSE, CAPILLARY: Glucose-Capillary: 141 mg/dL — ABNORMAL HIGH (ref 70–99)

## 2019-12-09 LAB — HEMOGLOBIN A1C
Hgb A1c MFr Bld: 7.5 % — ABNORMAL HIGH (ref 4.8–5.6)
Mean Plasma Glucose: 168.55 mg/dL

## 2019-12-09 NOTE — Pre-Procedure Instructions (Signed)
[facility] [phone], [place] - [address] [place] [address] Phone: [phone] Fax: [phone]     Your procedure is scheduled on Wednesday, July 21st.  Report to [facility] Main Entrance "A" at 06:30 A.M., and check in at  the Admitting office.  Call this number if you have problems the morning of surgery:  [phone]  Call [phone] if you have any questions prior to your surgery date Monday-Friday 8am-4pm    Remember:  Do not eat after midnight the night before your surgery.  You may drink clear liquids until 05:30 AM the morning of your surgery.   Clear liquids allowed are: Water, Non-Citrus Juices (without pulp), Carbonated Beverages, Clear Tea, Black Coffee Only, and Gatorade.    Take these medicines the morning of surgery with A SIP OF WATER: atorvastatin (LIPITOR) buPROPion (WELLBUTRIN XL)  FLUoxetine (PROZAC)  IF NEEDED: acetaminophen (TYLENOL)   As of today, STOP taking any Aspirin (unless otherwise instructed by your surgeon), meloxicam (MOBIC), Aleve, Naproxen, Ibuprofen, Motrin, Advil, Goody's, BC's, all herbal medications, fish oil, and all vitamins.   WHAT DO I DO ABOUT MY DIABETES MEDICATION?  . THE DAY BEFORE SURGERY, do not take SYNJARDY.      . THE MORNING OF SURGERY, do not take SYNJARDY or Dulaglutide (TRULICITY).   HOW TO MANAGE YOUR DIABETES BEFORE AND AFTER SURGERY  Why is it important to control my blood sugar before and after surgery? . Improving blood sugar levels before and after surgery helps healing and can limit problems. . A way of improving blood sugar control is eating a healthy diet by: o  Eating less sugar and carbohydrates o  Increasing activity/exercise o  Talking with your doctor about reaching your blood sugar goals . High blood sugars (greater than 180 mg/dL) can raise your risk of infections and slow your recovery, so you will need to focus on controlling your diabetes during  the weeks before surgery. . Make sure that the doctor who takes care of your diabetes knows about your planned surgery including the date and location.  How do I manage my blood sugar before surgery? . Check your blood sugar at least 4 times a day, starting 2 days before surgery, to make sure that the level is not too high or low. . Check your blood sugar the morning of your surgery when you wake up and every 2 hours until you get to the Short Stay unit. o If your blood sugar is less than 70 mg/dL, you will need to treat for low blood sugar: - Do not take insulin. - Treat a low blood sugar (less than 70 mg/dL) with  cup of clear juice (cranberry or apple), 4 glucose tablets, OR glucose gel. - Recheck blood sugar in 15 minutes after treatment (to make sure it is greater than 70 mg/dL). If your blood sugar is not greater than 70 mg/dL on recheck, call [phone] for further instructions. . Report your blood sugar to the short stay nurse when you get to Short Stay.  . If you are admitted to the hospital after surgery: o Your blood sugar will be checked by the staff and you will probably be given insulin after surgery (instead of oral diabetes medicines) to make sure you have good blood sugar levels. o The goal for blood sugar control after surgery is 80-180 mg/dL.         The Morning of Surgery:  Do not wear jewelry, make up, or nail polish.            Do not wear lotions, powders, perfumes, or deodorant.            Do not shave 48 hours prior to surgery.             Do not bring valuables to the hospital.            [facility] is not responsible for any belongings or valuables.  Do NOT Smoke (Tobacco/Vaping) or drink Alcohol 24 hours prior to your procedure. If you use a CPAP at night, you may bring all equipment for your overnight stay.   Contacts, glasses, dentures or bridgework may not be worn into surgery.      For patients admitted to the hospital, discharge time will  be determined by your treatment team.   Patients discharged the day of surgery will not be allowed to drive home, and someone needs to stay with them for 24 hours.    Special instructions:   [facility]- Preparing For Surgery  Before surgery, you can play an important role. Because skin is not sterile, your skin needs to be as free of germs as possible. You can reduce the number of germs on your skin by washing with CHG (chlorahexidine gluconate) Soap before surgery.  CHG is an antiseptic cleaner which kills germs and bonds with the skin to continue killing germs even after washing.    Oral Hygiene is also important to reduce your risk of infection.  Remember - BRUSH YOUR TEETH THE MORNING OF SURGERY WITH YOUR REGULAR TOOTHPASTE  Please do not use if you have an allergy to CHG or antibacterial soaps. If your skin becomes reddened/irritated stop using the CHG.  Do not shave (including legs and underarms) for at least 48 hours prior to first CHG shower. It is OK to shave your face.  Please follow these instructions carefully.   1. Shower the NIGHT BEFORE SURGERY and the MORNING OF SURGERY with CHG Soap.   2. If you chose to wash your hair, wash your hair first as usual with your normal shampoo.  3. After you shampoo, rinse your hair and body thoroughly to remove the shampoo.  4. Use CHG as you would any other liquid soap. You can apply CHG directly to the skin and wash gently with a scrungie or a clean washcloth.   5. Apply the CHG Soap to your body ONLY FROM THE NECK DOWN.  Do not use on open wounds or open sores. Avoid contact with your eyes, ears, mouth and genitals (private parts). Wash Face and genitals (private parts)  with your normal soap.   6. Wash thoroughly, paying special attention to the area where your surgery will be performed.  7. Thoroughly rinse your body with warm water from the neck down.  8. DO NOT shower/wash with your normal soap after using and rinsing off the  CHG Soap.  9. Pat yourself dry with a CLEAN TOWEL.  10. Wear CLEAN PAJAMAS to bed the night before surgery  11. Place CLEAN SHEETS on your bed the night of your first shower and DO NOT SLEEP WITH PETS.   Day of Surgery: Wear Clean/Comfortable clothing the morning of surgery Do not apply any deodorants/lotions.   Remember to brush your teeth WITH YOUR REGULAR TOOTHPASTE.   Please read over the following fact sheets that you were given.

## 2019-12-09 NOTE — Progress Notes (Signed)
PCP - Laurann Montana Cardiologist - denies   EKG - 12/09/19 Stress Test - denies ECHO - denies Cardiac Cath - denies  Sleep Study - 2019 CPAP - uses "sometimes"  Fasting Blood Sugar - 120's Checks Blood Sugar once a week  Aspirin Instructions: Patient instructed to hold all Aspirin, NSAID's, herbal medications, fish oil and vitamins 7 days prior to surgery.   COVID TEST- patient vaccinated- copy of vaccination card in chart. If testing required will need DOS   Anesthesia review:   Patient denies shortness of breath, fever, cough and chest pain at PAT appointment   All instructions explained to the patient, with a verbal understanding of the material. Patient agrees to go over the instructions while at home for a better understanding. Patient also instructed to self quarantine after being tested for COVID-19. The opportunity to ask questions was provided.

## 2019-12-15 NOTE — Anesthesia Preprocedure Evaluation (Addendum)
Anesthesia Evaluation  Patient identified by MRN, date of birth, ID band Patient awake    Reviewed: Allergy & Precautions, NPO status , Patient's Chart, lab work & pertinent test results  Airway Mallampati: II  TM Distance: >3 FB Neck ROM: Full    Dental  (+) Teeth Intact, Dental Advisory Given   Pulmonary sleep apnea and Continuous Positive Airway Pressure Ventilation ,    Pulmonary exam normal breath sounds clear to auscultation       Cardiovascular hypertension, Pt. on medications Normal cardiovascular exam Rhythm:Regular Rate:Normal     Neuro/Psych  Headaches, PSYCHIATRIC DISORDERS Depression    GI/Hepatic negative GI ROS, Neg liver ROS,   Endo/Other  diabetes, Type 2, Oral Hypoglycemic AgentsMorbid obesityBMI 61 Last a1c 7.5  Renal/GU negative Renal ROS  Female GU complaint     Musculoskeletal  (+) Arthritis , Osteoarthritis,    Abdominal (+) + obese,   Peds  Hematology negative hematology ROS (+)   Anesthesia Other Findings   Reproductive/Obstetrics negative OB ROS                            Anesthesia Physical Anesthesia Plan  ASA: III  Anesthesia Plan: General   Post-op Pain Management:    Induction: Intravenous  PONV Risk Score and Plan: 4 or greater and Ondansetron, Dexamethasone, Treatment may vary due to age or medical condition and Midazolam  Airway Management Planned: LMA and Oral ETT  Additional Equipment: None  Intra-op Plan:   Post-operative Plan: Extubation in OR  Informed Consent: I have reviewed the patients History and Physical, chart, labs and discussed the procedure including the risks, benefits and alternatives for the proposed anesthesia with the patient or authorized representative who has indicated his/her understanding and acceptance.     Dental advisory given  Plan Discussed with: CRNA  Anesthesia Plan Comments:        Anesthesia  Quick Evaluation

## 2019-12-16 ENCOUNTER — Encounter (HOSPITAL_COMMUNITY): Payer: Self-pay | Admitting: Obstetrics and Gynecology

## 2019-12-16 ENCOUNTER — Ambulatory Visit (HOSPITAL_COMMUNITY): Payer: BC Managed Care – PPO | Admitting: Anesthesiology

## 2019-12-16 ENCOUNTER — Ambulatory Visit (HOSPITAL_COMMUNITY)
Admission: RE | Admit: 2019-12-16 | Discharge: 2019-12-16 | Disposition: A | Discharge status: Home / Self Care | Payer: BC Managed Care – PPO | Attending: Obstetrics and Gynecology | Admitting: Obstetrics and Gynecology

## 2019-12-16 ENCOUNTER — Other Ambulatory Visit: Payer: Self-pay

## 2019-12-16 ENCOUNTER — Encounter (HOSPITAL_COMMUNITY)
Admission: RE | Disposition: A | Discharge status: Home / Self Care | Payer: Self-pay | Source: Home / Self Care | Attending: Obstetrics and Gynecology

## 2019-12-16 DIAGNOSIS — T8332XA Displacement of intrauterine contraceptive device, initial encounter: Secondary | ICD-10-CM | POA: Diagnosis not present

## 2019-12-16 DIAGNOSIS — I10 Essential (primary) hypertension: Secondary | ICD-10-CM | POA: Diagnosis not present

## 2019-12-16 DIAGNOSIS — G4730 Sleep apnea, unspecified: Secondary | ICD-10-CM | POA: Diagnosis not present

## 2019-12-16 DIAGNOSIS — Z7984 Long term (current) use of oral hypoglycemic drugs: Secondary | ICD-10-CM | POA: Diagnosis not present

## 2019-12-16 DIAGNOSIS — E119 Type 2 diabetes mellitus without complications: Secondary | ICD-10-CM | POA: Diagnosis not present

## 2019-12-16 DIAGNOSIS — N882 Stricture and stenosis of cervix uteri: Secondary | ICD-10-CM | POA: Diagnosis not present

## 2019-12-16 DIAGNOSIS — Z20822 Contact with and (suspected) exposure to covid-19: Secondary | ICD-10-CM | POA: Diagnosis not present

## 2019-12-16 DIAGNOSIS — N920 Excessive and frequent menstruation with regular cycle: Secondary | ICD-10-CM | POA: Diagnosis not present

## 2019-12-16 DIAGNOSIS — M1711 Unilateral primary osteoarthritis, right knee: Secondary | ICD-10-CM | POA: Insufficient documentation

## 2019-12-16 DIAGNOSIS — F329 Major depressive disorder, single episode, unspecified: Secondary | ICD-10-CM | POA: Diagnosis not present

## 2019-12-16 DIAGNOSIS — Y762 Prosthetic and other implants, materials and accessory obstetric and gynecological devices associated with adverse incidents: Secondary | ICD-10-CM | POA: Diagnosis not present

## 2019-12-16 DIAGNOSIS — Z79899 Other long term (current) drug therapy: Secondary | ICD-10-CM | POA: Insufficient documentation

## 2019-12-16 DIAGNOSIS — Z6841 Body Mass Index (BMI) 40.0 and over, adult: Secondary | ICD-10-CM | POA: Diagnosis not present

## 2019-12-16 DIAGNOSIS — Z8249 Family history of ischemic heart disease and other diseases of the circulatory system: Secondary | ICD-10-CM | POA: Insufficient documentation

## 2019-12-16 HISTORY — PX: INTRAUTERINE DEVICE (IUD) INSERTION: SHX5877

## 2019-12-16 HISTORY — PX: DILATATION & CURETTAGE/HYSTEROSCOPY WITH MYOSURE: SHX6511

## 2019-12-16 HISTORY — PX: IUD REMOVAL: SHX5392

## 2019-12-16 LAB — GLUCOSE, CAPILLARY
Glucose-Capillary: 170 mg/dL — ABNORMAL HIGH (ref 70–99)
Glucose-Capillary: 153 mg/dL — ABNORMAL HIGH (ref 70–99)

## 2019-12-16 LAB — POCT PREGNANCY, URINE: Preg Test, Ur: NEGATIVE

## 2019-12-16 LAB — SARS CORONAVIRUS 2 BY RT PCR (HOSPITAL ORDER, PERFORMED IN ~~LOC~~ HOSPITAL LAB): SARS Coronavirus 2: NEGATIVE

## 2019-12-16 SURGERY — DILATATION & CURETTAGE/HYSTEROSCOPY WITH MYOSURE
Anesthesia: General | Site: Vagina

## 2019-12-16 MED ORDER — SUCCINYLCHOLINE CHLORIDE 200 MG/10ML IV SOSY
PREFILLED_SYRINGE | INTRAVENOUS | Status: DC | PRN
  Administered 2019-12-16: 180 mg via INTRAVENOUS

## 2019-12-16 MED ORDER — ONDANSETRON HCL 4 MG/2ML IJ SOLN
INTRAMUSCULAR | Status: DC | PRN
  Administered 2019-12-16: 4 mg via INTRAVENOUS

## 2019-12-16 MED ORDER — MIDAZOLAM HCL 2 MG/2ML IJ SOLN
INTRAMUSCULAR | Status: AC
  Filled 2019-12-16: qty 2

## 2019-12-16 MED ORDER — FENTANYL CITRATE (PF) 250 MCG/5ML IJ SOLN
INTRAMUSCULAR | Status: AC
  Filled 2019-12-16: qty 5

## 2019-12-16 MED ORDER — OXYCODONE HCL 5 MG/5ML PO SOLN: 5.0000 mg | Freq: Once | ORAL | Status: DC | PRN

## 2019-12-16 MED ORDER — SODIUM CHLORIDE 0.9 % IR SOLN
Status: DC | PRN
  Administered 2019-12-16: 3000 mL

## 2019-12-16 MED ORDER — ONDANSETRON HCL 4 MG/2ML IJ SOLN
INTRAMUSCULAR | Status: AC
  Filled 2019-12-16: qty 2

## 2019-12-16 MED ORDER — ACETAMINOPHEN 500 MG PO TABS
1000.0000 mg | ORAL_TABLET | Freq: Four times a day (QID) | ORAL | 0 refills | Status: AC | PRN
Start: 2019-12-16 — End: ?

## 2019-12-16 MED ORDER — LEVONORGESTREL 20 MCG/24HR IU IUD
INTRAUTERINE_SYSTEM | INTRAUTERINE | Status: AC
  Filled 2019-12-16: qty 1

## 2019-12-16 MED ORDER — LIDOCAINE HCL 2 % IJ SOLN
INTRAMUSCULAR | Status: AC
  Filled 2019-12-16: qty 20

## 2019-12-16 MED ORDER — CHLORHEXIDINE GLUCONATE 0.12 % MT SOLN
15.0000 mL | Freq: Once | OROMUCOSAL | Status: AC
  Administered 2019-12-16: 15 mL via OROMUCOSAL
  Filled 2019-12-16: qty 15

## 2019-12-16 MED ORDER — MIDAZOLAM HCL 5 MG/5ML IJ SOLN
INTRAMUSCULAR | Status: DC | PRN
  Administered 2019-12-16: 2 mg via INTRAVENOUS

## 2019-12-16 MED ORDER — FENTANYL CITRATE (PF) 250 MCG/5ML IJ SOLN
INTRAMUSCULAR | Status: DC | PRN
  Administered 2019-12-16 (×3): 50 ug via INTRAVENOUS
  Administered 2019-12-16: 100 ug via INTRAVENOUS

## 2019-12-16 MED ORDER — HYDROMORPHONE HCL 1 MG/ML IJ SOLN: 0.2500 mg | INTRAMUSCULAR | Status: DC | PRN

## 2019-12-16 MED ORDER — ACETAMINOPHEN 500 MG PO TABS
1000.0000 mg | ORAL_TABLET | Freq: Once | ORAL | Status: AC
  Administered 2019-12-16: 1000 mg via ORAL
  Filled 2019-12-16: qty 2

## 2019-12-16 MED ORDER — POVIDONE-IODINE 10 % EX SWAB: 2.0000 "application " | Freq: Once | CUTANEOUS | Status: DC

## 2019-12-16 MED ORDER — PROPOFOL 10 MG/ML IV BOLUS
INTRAVENOUS | Status: DC | PRN
  Administered 2019-12-16: 200 mg via INTRAVENOUS

## 2019-12-16 MED ORDER — LIDOCAINE 2% (20 MG/ML) 5 ML SYRINGE
INTRAMUSCULAR | Status: DC | PRN
  Administered 2019-12-16: 60 mg via INTRAVENOUS

## 2019-12-16 MED ORDER — DEXAMETHASONE SODIUM PHOSPHATE 10 MG/ML IJ SOLN
INTRAMUSCULAR | Status: AC
  Filled 2019-12-16: qty 2

## 2019-12-16 MED ORDER — LIDOCAINE 2% (20 MG/ML) 5 ML SYRINGE
INTRAMUSCULAR | Status: AC
  Filled 2019-12-16: qty 5

## 2019-12-16 MED ORDER — VASOPRESSIN 20 UNIT/ML IV SOLN
INTRAVENOUS | Status: AC
  Filled 2019-12-16: qty 1

## 2019-12-16 MED ORDER — LACTATED RINGERS IV SOLN: INTRAVENOUS | Status: DC

## 2019-12-16 MED ORDER — EPHEDRINE 5 MG/ML INJ
INTRAVENOUS | Status: AC
  Filled 2019-12-16: qty 10

## 2019-12-16 MED ORDER — PHENYLEPHRINE 40 MCG/ML (10ML) SYRINGE FOR IV PUSH (FOR BLOOD PRESSURE SUPPORT)
PREFILLED_SYRINGE | INTRAVENOUS | Status: AC
  Filled 2019-12-16: qty 10

## 2019-12-16 MED ORDER — SILVER NITRATE-POT NITRATE 75-25 % EX MISC
CUTANEOUS | Status: AC
  Filled 2019-12-16: qty 10

## 2019-12-16 MED ORDER — ROCURONIUM BROMIDE 10 MG/ML (PF) SYRINGE
PREFILLED_SYRINGE | INTRAVENOUS | Status: AC
  Filled 2019-12-16: qty 10

## 2019-12-16 MED ORDER — LIDOCAINE HCL 2 % IJ SOLN
INTRAMUSCULAR | Status: DC | PRN
  Administered 2019-12-16: 10 mL

## 2019-12-16 MED ORDER — ORAL CARE MOUTH RINSE: 15.0000 mL | Freq: Once | OROMUCOSAL | Status: AC

## 2019-12-16 MED ORDER — PROPOFOL 10 MG/ML IV BOLUS
INTRAVENOUS | Status: AC
  Filled 2019-12-16: qty 20

## 2019-12-16 MED ORDER — KETOROLAC TROMETHAMINE 30 MG/ML IJ SOLN: 30.0000 mg | Freq: Once | INTRAMUSCULAR | Status: DC | PRN

## 2019-12-16 MED ORDER — PROMETHAZINE HCL 25 MG/ML IJ SOLN: 6.2500 mg | INTRAMUSCULAR | Status: DC | PRN

## 2019-12-16 MED ORDER — SUCCINYLCHOLINE CHLORIDE 200 MG/10ML IV SOSY
PREFILLED_SYRINGE | INTRAVENOUS | Status: AC
  Filled 2019-12-16: qty 10

## 2019-12-16 MED ORDER — SILVER NITRATE-POT NITRATE 75-25 % EX MISC
CUTANEOUS | Status: DC | PRN
  Administered 2019-12-16: 1 via TOPICAL

## 2019-12-16 MED ORDER — SODIUM CHLORIDE (PF) 0.9 % IJ SOLN
INTRAMUSCULAR | Status: AC
  Filled 2019-12-16: qty 50

## 2019-12-16 MED ORDER — DEXAMETHASONE SODIUM PHOSPHATE 10 MG/ML IJ SOLN
INTRAMUSCULAR | Status: DC | PRN
  Administered 2019-12-16: 10 mg via INTRAVENOUS

## 2019-12-16 MED ORDER — OXYCODONE HCL 5 MG PO TABS: 5.0000 mg | ORAL_TABLET | Freq: Once | ORAL | Status: DC | PRN

## 2019-12-16 SURGICAL SUPPLY — 22 items
CANISTER SUCT 3000ML PPV (MISCELLANEOUS) ×3 IMPLANT
CATH ROBINSON RED A/P 16FR (CATHETERS) ×3 IMPLANT
DEVICE MYOSURE LITE (MISCELLANEOUS) IMPLANT
DEVICE MYOSURE REACH (MISCELLANEOUS) IMPLANT
DILATOR CANAL MILEX (MISCELLANEOUS) ×3 IMPLANT
GLOVE BIO SURGEON STRL SZ7 (GLOVE) ×3 IMPLANT
GLOVE BIOGEL PI IND STRL 7.0 (GLOVE) ×2 IMPLANT
GLOVE BIOGEL PI INDICATOR 7.0 (GLOVE) ×4
GOWN STRL REUS W/ TWL LRG LVL3 (GOWN DISPOSABLE) ×2 IMPLANT
GOWN STRL REUS W/TWL LRG LVL3 (GOWN DISPOSABLE) ×6
HOVERMATT SINGLE USE (MISCELLANEOUS) ×3 IMPLANT
KIT PROCEDURE FLUENT (KITS) ×3 IMPLANT
KIT TURNOVER KIT B (KITS) ×3 IMPLANT
Mirena Intrauterine Device ×5 IMPLANT
PACK VAGINAL MINOR WOMEN LF (CUSTOM PROCEDURE TRAY) ×3 IMPLANT
PAD OB MATERNITY 4.3X12.25 (PERSONAL CARE ITEMS) ×3 IMPLANT
SEAL ROD LENS SCOPE MYOSURE (ABLATOR) ×3 IMPLANT
SLEEVE SCD COMPRESS KNEE XLRG (MISCELLANEOUS) ×3 IMPLANT
SUT VIC AB 3-0 SH 27 (SUTURE) ×3
SUT VIC AB 3-0 SH 27XBRD (SUTURE) ×1 IMPLANT
TOWEL GREEN STERILE FF (TOWEL DISPOSABLE) ×6 IMPLANT
UNDERPAD 30X36 HEAVY ABSORB (UNDERPADS AND DIAPERS) ×3 IMPLANT

## 2019-12-16 NOTE — Brief Op Note (Signed)
12/16/2019  10:24 AM  PATIENT:  Stacey Graham  39 y.o. female  PRE-OPERATIVE DIAGNOSIS:  N92.0-Menorrhagia with regular cycle, Malpositioned IUD, Cervical stenosis  POST-OPERATIVE DIAGNOSIS:  -Menorrhagia with regular cycle  PROCEDURE:  Procedure(s): DILATATION & CURETTAGE/HYSTEROSCOPY (N/A) INTRAUTERINE DEVICE (IUD) REMOVAL (N/A) INTRAUTERINE DEVICE (IUD) INSERTION (N/A)  SURGEON:  Surgeon(s) and Role:    Geryl Rankins, MD - Primary  PHYSICIAN ASSISTANT:   ASSISTANTS: Technician   ANESTHESIA:   local  EBL:  50 mL   BLOOD ADMINISTERED:none  DRAINS: none   LOCAL MEDICATIONS USED:  2% LIDOCAINE  and Amount: 10 ml  SPECIMEN:  Source of Specimen:  Endometrial currettings  DISPOSITION OF SPECIMEN:  PATHOLOGY  COUNTS:  YES  TOURNIQUET:  * No tourniquets in log *  DICTATION: .Other Dictation: Dictation Number [id number]  PLAN OF CARE: Discharge to home after PACU  PATIENT DISPOSITION:  PACU - hemodynamically stable.   Delay start of Pharmacological VTE agent (>24hrs) due to surgical blood loss or risk of bleeding: not applicable

## 2019-12-16 NOTE — Transfer of Care (Signed)
Immediate Anesthesia Transfer of Care Note  Patient: Stacey Graham  Procedure(s) Performed: DILATATION & CURETTAGE/HYSTEROSCOPY (N/A Vagina ) INTRAUTERINE DEVICE (IUD) REMOVAL (N/A Vagina ) INTRAUTERINE DEVICE (IUD) INSERTION (N/A Vagina )  Patient Location: PACU  Anesthesia Type:General  Level of Consciousness: awake, alert , oriented and patient cooperative  Airway & Oxygen Therapy: Patient Spontanous Breathing and Patient connected to face mask oxygen  Post-op Assessment: Report given to RN and Post -op Vital signs reviewed and stable  Post vital signs: Reviewed and stable  Last Vitals:  Vitals Value Taken Time  BP 125/71 12/16/19 1009  Temp    Pulse 88 12/16/19 1012  Resp 19 12/16/19 1012  SpO2 100 % 12/16/19 1012  Vitals shown include unvalidated device data.  Last Pain:  Vitals:   12/16/19 0718  TempSrc: Oral  PainSc:          Complications: No complications documented.

## 2019-12-16 NOTE — Op Note (Signed)
NAMECARINNA, Graham MEDICAL RECORD [id number] ACCOUNT [id number] DATE OF BIRTH:07-13-1980 FACILITY: [facility] LOCATION: [place], [place]  OPERATIVE REPORT  DATE OF PROCEDURE:  12/16/2019  PREOPERATIVE DIAGNOSES:   1.  Abnormal uterine bleeding.   2.  Malpositioned intrauterine device. 3.  Cervical stenosis.  POSTOPERATIVE DIAGNOSES: 1.  Abnormal uterine bleeding.   2.  Malpositioned intrauterine device. 3.  Cervical stenosis.  PROCEDURE:   1.  Hysteroscopy.  2.  Dilatation and curettage.  3.  Intrauterine device removal and insertion.  SURGEON:  Thurnell Lose, MD  ASSISTANT:  Technician.  ANESTHESIA:  Local.  ESTIMATED BLOOD LOSS:  50 mL.  BLOOD ADMINISTERED:  None.  DRAINS:  None.  LOCAL:  Lidocaine 2%, 10 mL.  SPECIMEN:  Endometrial curettings.  DISPOSITION OF SPECIMEN:  To pathology.  COUNTS CORRECT:  Yes.  DISPOSITION:  To PACU, hemodynamically stable.  COMPLICATIONS:  None.  FINDINGS:  IUD appeared to be in the lower uterine segment.  No signs of any perforation.  Normal appearing endometrial cavity.  No obvious masses.  DESCRIPTION OF PROCEDURE:  The patient was identified in the holding area.  She was then taken to the operating room with IV running.  She was placed in the dorsal supine position, underwent general anesthesia without complication.  She was placed in the  dorsal lithotomy position, prepped and draped in a normal sterile fashion.  A timeout was performed.  SCDs were on her legs and operating.  The patient's habitus was such that she had a very anterior cervix  deep in the pelvic vault, so we had long instruments available.  However, on my first bimanual exam, the cervix was less anterior and did not require the extra-long instruments.  We did  have a long Graves that initially I used to find the cervix, but I was able to grasp the cervix on the anterior lip and then I was able to bring it forward and then I was  able to use a shorter Graves speculum.  The long speculum was not flush with the  patient and longer instruments were too long to use.  Once the cervix was identified, the cervix was then dilated.  I used the os finder originally which seemed to go smoothly and then she was dilated up to a 6 Hegar.  I then attempted to use the hysteroscope, but met resistance.  The tenaculum did come  off, which caused a cervical laceration, but there was no active bleeding.  The hysteroscope was then removed.  The 6 Hegar seemed to be appropriate, but I guess due to the stenosis, as I tried to insert the hysteroscope, there was some difficulty.  I  then went over to a 23 Pratt and eventually the MyoSure hysteroscope was advanced.  I saw the strings that were curled up on the IUD inside the cervix and that was identified and grasped with the hysteroscopic biopsy forceps and the IUD was easily  removed.  Of note, prior to using the hysteroscope and biopsy forceps, once the patient was dilated, I attempted to use the uterine forceps to blindly grasp the strings; however, the strings after further review on hysteroscopy were probably either at  the internal os or above the internal os.  Once IUD was removed, the hysteroscope was advanced and the entire endometrial cavity was viewed.  The camera was removed.  A gentle curettage was performed of all 4 quadrants.  Tissue was returned.  The uterus was then sounded to 8 cm  and the IUD was  easily inserted.  Due to the laceration and the depth, I opted to close the laceration with 3-0 Vicryl in a continuous fashion.  Once I removed the single-tooth, I closed the laceration and when I went to go grab the suture to make the knot at the base,   I grabbed the IUD strings, which were the same color as the string, which was a dark blue and dark black color, so the IUD was actually removed when I was trying to tie the knot.  The IUD was then completely removed and the suture was secured.   We  waited approximately 10 minutes for another IUD to return and again was sounded to 8 cm and it was easily inserted.  The strings were trimmed and left very long.  Silver nitrate was used because I had to apply the single-tooth tenaculum the second time  to insert the IUD again.  There was no bleeding from the tenaculum site.  Everything appeared hemostatic.  All instruments were removed from the vagina.  All instrument, sponge and needle counts were correct x3.  The patient was taken to the recovery room in stable condition.  VN/NUANCE  D:12/16/2019 T:12/16/2019 JOB:[id number]

## 2019-12-16 NOTE — Anesthesia Postprocedure Evaluation (Signed)
Anesthesia Post Note  Patient: Stacey Graham  Procedure(s) Performed: DILATATION & CURETTAGE/HYSTEROSCOPY (N/A Vagina ) INTRAUTERINE DEVICE (IUD) REMOVAL (N/A Vagina ) INTRAUTERINE DEVICE (IUD) INSERTION (N/A Vagina )     Patient location during evaluation: PACU Anesthesia Type: General Level of consciousness: awake and alert, oriented and patient cooperative Pain management: pain level controlled Vital Signs Assessment: post-procedure vital signs reviewed and stable Respiratory status: spontaneous breathing, nonlabored ventilation and respiratory function stable Cardiovascular status: blood pressure returned to baseline and stable Postop Assessment: no apparent nausea or vomiting Anesthetic complications: no   No complications documented.  Last Vitals:  Vitals:   12/16/19 1039 12/16/19 1040  BP:  119/79  Pulse: 81 74  Resp: 16 17  Temp: 36.7 C 36.7 C  SpO2: 99% 99%    Last Pain:  Vitals:   12/16/19 1039  TempSrc:   PainSc: 0-No pain                 Lannie Fields

## 2019-12-16 NOTE — Anesthesia Procedure Notes (Signed)
Procedure Name: Intubation Date/Time: 12/16/2019 8:50 AM Performed by: Shirlyn Goltz, CRNA Pre-anesthesia Checklist: Patient identified, Emergency Drugs available, Suction available and Patient being monitored Patient Re-evaluated:Patient Re-evaluated prior to induction Oxygen Delivery Method: Circle system utilized Preoxygenation: Pre-oxygenation with 100% oxygen Induction Type: IV induction Ventilation: Mask ventilation without difficulty Laryngoscope Size: Mac and 4 Grade View: Grade I Tube type: Oral Tube size: 7.0 mm Number of attempts: 1 Airway Equipment and Method: Stylet Placement Confirmation: ETT inserted through vocal cords under direct vision,  positive ETCO2 and breath sounds checked- equal and bilateral Secured at: 22 cm Tube secured with: Tape Dental Injury: Teeth and Oropharynx as per pre-operative assessment

## 2019-12-16 NOTE — H&P (Signed)
History of Present Illness  General:          39 y/o presents for preoperative examination prior to Hyst/D&C, IUD removal/reinsertion, and possible Myosure for removal of endometrial mass if present.        Pelvic US 09/18/2019, IUD low lying within the endometrial cavity at level of low uterine segment/ endovcervical canal. Uterus measured 10.1 x 4.2 x 5.7 cm, no fibroids. Endometrial stripe measured 8 mm in thickness. Rt ov not visualized, lt ov wnl, no adnexal masses of FF seen.        No concerns with vaginal discharge.        No personal or family h/o problems with anesthesia. 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         Does patient report new onset of COVID symptoms? No.         Has patient or close contact tested positive for COVID-19? No , not in the past 2 weeks.      Current Medications  Taking  .Trulicity(Dulaglutide) 1.5 MG/0.5ML Solution Pen-injector inject Subcutaneous once a week    .Synjardy(Empagliflozin-metFORMIN HCl) 12.09-998 MG Tablet 1 tablet with meals Orally Twice a day    .Lisinopril-hydroCHLOROthiazide 20-12.5 MG Tablet 1 tablet Orally Once a day    .Atorvastatin Calcium 10 MG Tablet 1 tablet Orally Once a day    .Wellbutrin XL(buPROPion HCl ER (XL)) 150 MG Tablet Extended Release 24 Hour 1 tablet in the morning Orally Once a day    .FLUoxetine HCl 20 MG Capsule 1 capsule Orally Once a day    .Meloxicam 15 MG Tablet 1 tablet Orally Once a day    .FreeStyle Libre 14 Day Sensor - Miscellaneous change every 2 weeks     .Milk Thistle 500 MG Capsule Orally     .Mirena 20 MCG/24HR Intrauterine Device Intrauterine , Notes: 2/16    .MVI as directed     .Probiotic - Capsule Orally , Notes: Yeast Guard    .Supplement: bitter melon, cinnamon with chrominum 2000/400, alpha-lipoic acid 600 mg 1 capsule twice a day    .Tylenol(APAP) 325 MG Tablet 2 tablets as needed Orally every 6 hrs     .Vitamin D 1000 UNIT Capsule as directed Orally    Not-Taking  .Cytotec(miSOPROStol) 200 MCG Tablet _insert one tablet 24 hours before procedure, _insert second tablet 12 hours before procedure Vaginal , Notes: ending    .Albuterol Sulfate HFA 108 (90 Base) MCG/ACT Aerosol Solution 2 puffs Inhalation every 4 hrs as needed for shortness of breath/wheezing    Medication List reviewed and reconciled with the patient       Past Medical History        Diabetes mellitus, declines statin 9/19, 9/20.         Hypertension.         Osteoarthritis, right knee.         low HDL.         Morbid obesity.         sleep apnea, CPAP initiated 8/17, Dr Vickey Huger.         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         Major depressive Disorder r/o OCD and r/o ADD, Dr Evelene Croon 12/20.         COVID 19 1/21.              Surgical History  LEEP, Dr Dion Body 11/19       Family History   Father: unknown, Crohn (rest of history unknown), diagnosed with Hypertension   Mother: alive, hpyertension, diabetes, CAD--MI at 66, diagnosed with Diabetes, Hypertension   Paternal Grand Father: deceased   Paternal Grand Mother: deceased   Maternal Grand Father: deceased, MI   Maternal Grand Mother: deceased, sickle cell,heart attack   Brother 1: deceased, congenital heart defect, died when he was 2-1/2   Brother2: alive, half sib   Sister 1: alive, half sib   Sister 2: alive, half sib   2 brother(s) , 2 sister(s) .    Maternal Aunt with either breast or uterine cancer.       Social History  General:          Tobacco use               cigarettes:  Never smoked             Tobacco history last updated  12/04/2019             Vaping  No        EXPOSURE TO PASSIVE SMOKE: exposed as a child.         Alcohol: yes, 1-2 glasses of liquor or wine per week.         Caffeine: yes, coffee, soda, tea.         no  Recreational drug use.         Exercise: walk to and from work.         Marital Status: single, has been sexaully active with female and female partners.         Children: none.         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         Religion: not important.         Seat belt use: yes.      Gyn History  Sexual activity  currently sexually active.   Periods : every month.   LMP 11/25/2019.   Birth control Mirena IUD.   Last pap smear date 10/30/2018-Neg/HPV neg.   Last mammogram date 10/08/2019.   Abnormal pap smear pt thinks may years ago but no bx done, assessed with colposcopy, 2019 LEEP.       OB History  Never been pregnant  per patient.       Allergies   N.K.D.A.       Hospitalization/Major Diagnostic Procedure   Type 2 diabetes-age 20    None in the past year 01/2019       Review of Systems  See scanned ROS form for details. Denies fever/chills, chest pain, SOB, headaches, numbness/tingling. No h/o complication with anesthesia, bleeding disorders or blood clots See HPI.     Vital Signs  Wt 376.3, Wt change 2.9 lb, Ht 64.5, BMI 63.59, Temp 97.3, Pulse sitting 102, BP sitting 138/90.     Physical Examination  Chaperone present:          Chaperone present  [facility] 12/04/2019 04:32:52 PM > , for pelvic exam.   GENERAL:          Patient appears  alert and oriented , alert and oriented.          General Appearance:  well-appearing, well-developed, no acute distress , well-appearing, well-developed, no acute distress.          Speech:  clear , clear.   LUNGS:  Auscultation:  no wheezing/rhonchi/rales. CTA bilaterally.          Effort:  no respiratory distress, comfortable breathing.   HEART:          Heart sounds:  normal. RRR. no murmur.   ABDOMEN:          General:  soft nontender, nondistended, no masses , no masses tenderness or organomegaly, non distended.   FEMALE GENITOURINARY:          Cervix   very anterior and not visualized.           Adnexa:  no mass, non tender.          Uterus:  normal size/shape/consistency, freely mobile, non tender.          Vagina:  pink/moist mucosa, no lesions, no abnormal discharge, Scant amount of menstrual blood in vault.          Vulva:  normal, no lesions, no skin discoloration.          Anus:  no external hemosrhoids.   EXTREMITIES:          general  no edema.          General:  No edema or calf tenderness.        Pt aware of scribe services today.     Assessments     1. Encounter for other preprocedural examination - Z01.818 (Primary)        Treatment   1. Encounter for other preprocedural examination   Start Cytotec Tablet, 200 MCG, 1 tablet, Vaginal, 12 and 24 hours prior to procedure, 1 day, 2 tablets, Refills 0 Notes: Pt counseled on R/B/A of procedure, including but not limited to infection, bleeding and injury to organs in the abdomen. Discussed recovery time. Reviewed and reconciled medication list with patient. No additional pre-operative clearance needed at this time, no concerning medical Dx that may complicate the procedure. Pt counseled on instructions for use of medication. Plan for f/u 2 week post op. .          Procedures  Scribe Documentation:          Attestation:  I personally scribed for Dr. Dion Body on the date of this appointment. Electronically signed by scribe , Justin Mend, Jani Files 12/04/2019 04:06:22 PM > .       Follow Up  2 Weeks post op

## 2019-12-16 NOTE — Interval H&P Note (Signed)
History and Physical Interval Note:  12/16/2019 8:55 AM  Stacey Graham  has presented today for surgery, with the diagnosis of N92.0-Menorrhagia with regular cycle.  The various methods of treatment have been discussed with the patient and family. After consideration of risks, benefits and other options for treatment, the patient has consented to  Procedure(s): DILATATION & CURETTAGE/HYSTEROSCOPY WITH POSSIBLE MYOSURE (N/A) INTRAUTERINE DEVICE (IUD) REMOVAL (N/A) INTRAUTERINE DEVICE (IUD) INSERTION (N/A) as a surgical intervention.  The patient's history has been reviewed, patient examined, no change in status, stable for surgery.  I have reviewed the patient's chart and labs.  Questions were answered to the patient's satisfaction.     Geryl Rankins

## 2019-12-16 NOTE — Discharge Instructions (Addendum)
Dilation and Curettage or Vacuum Curettage, Care After This sheet gives you information about how to care for yourself after your procedure. Your health care provider may also give you more specific instructions. If you have problems or questions, contact your health care provider. What can I expect after the procedure? After your procedure, it is common to have:  Mild pain or cramping.  Some vaginal bleeding or spotting. These may last for up to 2 weeks after your procedure. Follow these instructions at home: Activity   Do not drive or use heavy machinery while taking prescription pain medicine.  Avoid driving for the first 24 hours after your procedure.  Take frequent, short walks, followed by rest periods, throughout the day. Ask your health care provider what activities are safe for you. After 1-2 days, you may be able to return to your normal activities.  Do not lift anything heavier than 10 lb (4.5 kg) until your health care provider approves.  For at least 2 weeks, or as long as told by your health care provider, do not: ? Douche. ? Use tampons. ? Have sexual intercourse. General instructions   Take over-the-counter and prescription medicines only as told by your health care provider. This is especially important if you take blood thinning medicine.  Do not take baths, swim, or use a hot tub until your health care provider approves. Take showers instead of baths.  Wear compression stockings as told by your health care provider. These stockings help to prevent blood clots and reduce swelling in your legs.  It is your responsibility to get the results of your procedure. Ask your health care provider, or the department performing the procedure, when your results will be ready.  Keep all follow-up visits as told by your health care provider. This is important. Contact a health care provider if:  You have severe cramps that get worse or that do not get better with  medicine.  You have severe abdominal pain.  You cannot drink fluids without vomiting.  You develop pain in a different area of your pelvis.  You have bad-smelling vaginal discharge.  You have a rash. Get help right away if:  You have vaginal bleeding that soaks more than one sanitary pad in 1 hour, for 2 hours in a row.  You pass large blood clots from your vagina.  You have a fever that is above 100.4F (38.0C).  Your abdomen feels very tender or hard.  You have chest pain.  You have shortness of breath.  You cough up blood.  You feel dizzy or light-headed.  You faint.  You have pain in your neck or shoulder area. This information is not intended to replace advice given to you by your health care provider. Make sure you discuss any questions you have with your health care provider. Document Revised: 04/26/2017 Document Reviewed: 12/15/2015 Elsevier Patient Education  2020 Elsevier Inc.  

## 2019-12-17 ENCOUNTER — Encounter (HOSPITAL_COMMUNITY): Payer: Self-pay | Admitting: Obstetrics and Gynecology

## 2019-12-17 LAB — SURGICAL PATHOLOGY

## 2021-06-22 IMAGING — US US PELVIS COMPLETE WITH TRANSVAGINAL
1 series · 13 of 25 positions shown · non-contrast
Comparison: None

CLINICAL DATA: Initial evaluation for menorrhagia with regular
cycle, lost IUD threads.



[Series 1: us pelvis complete with transvaginal · 0.23mm/px · 59 acquisitions, 13 frames shown]
[im 1/59]
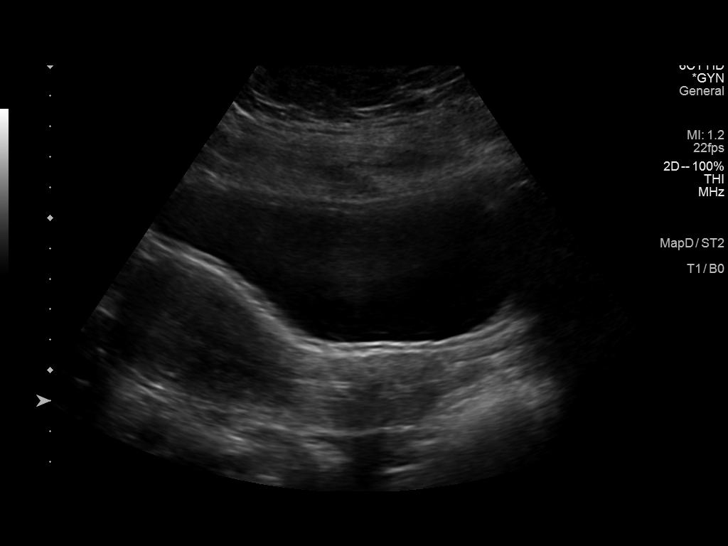
[im 5/59]
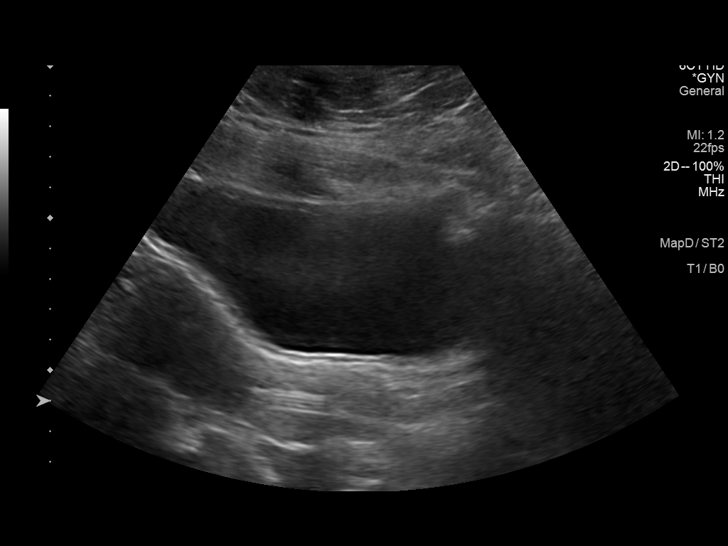
[im 10/59]
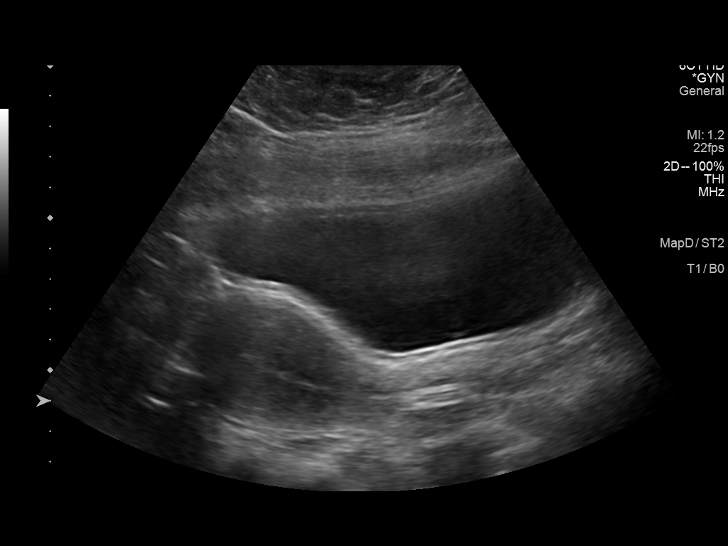
[im 15/59]
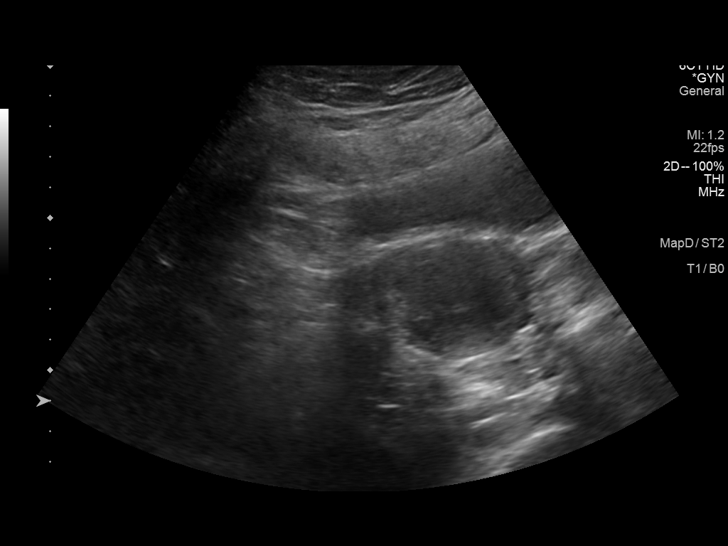
[im 20/59]
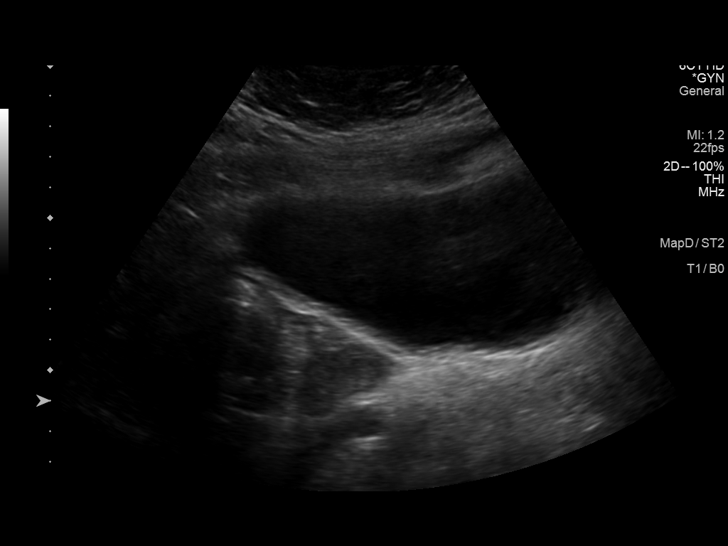
[im 25/59]
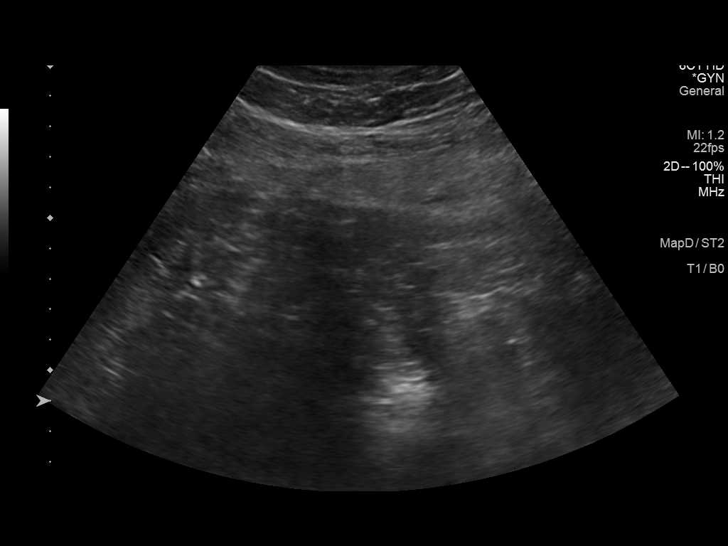
[im 30/59]
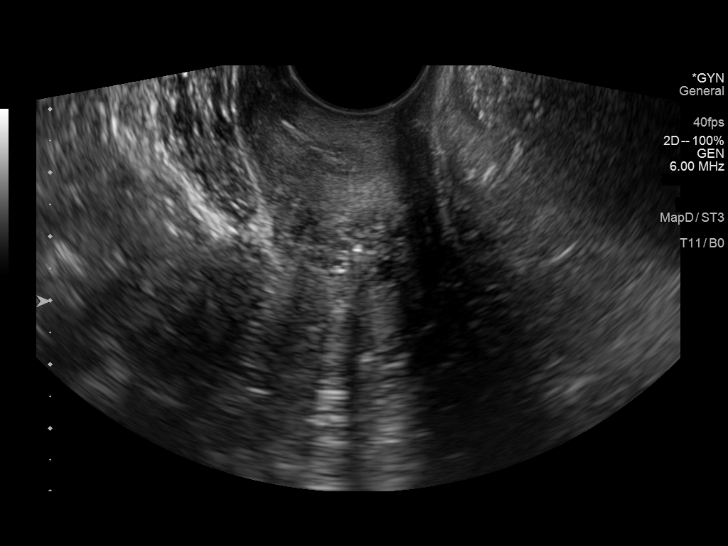
[im 34/59]
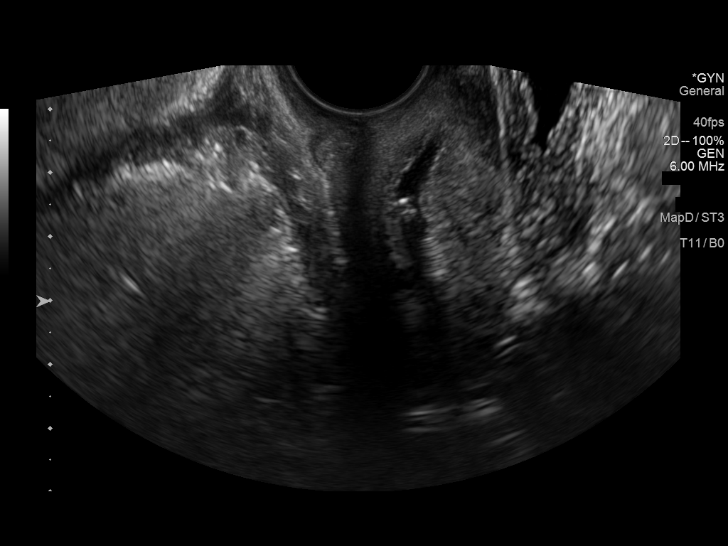
[im 39/59]
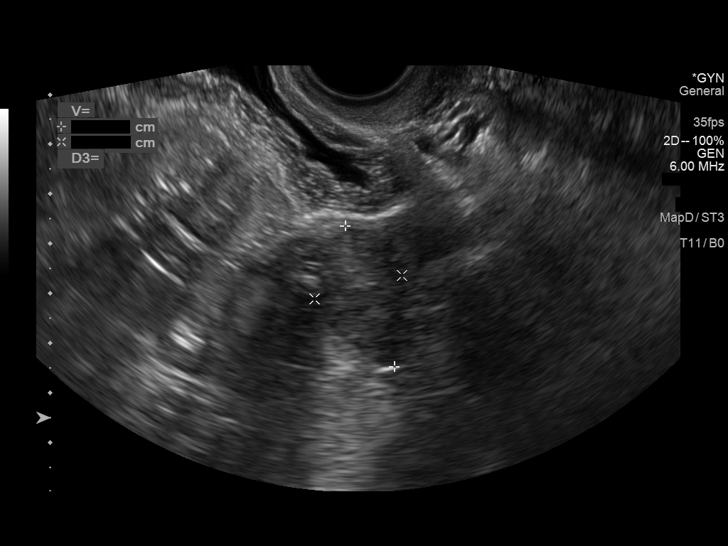
[im 44/59]
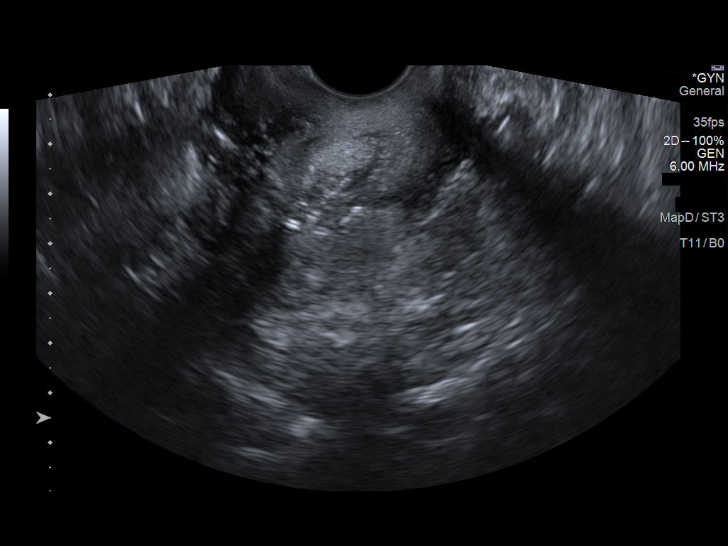
[im 49/59]
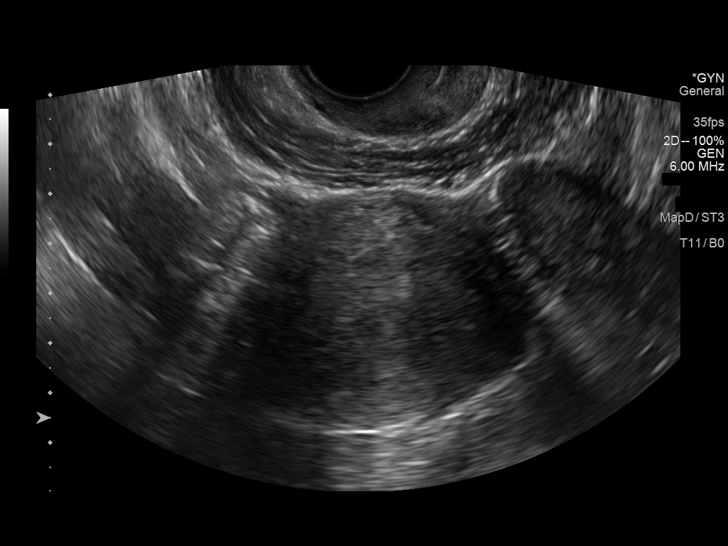
[im 54/59]
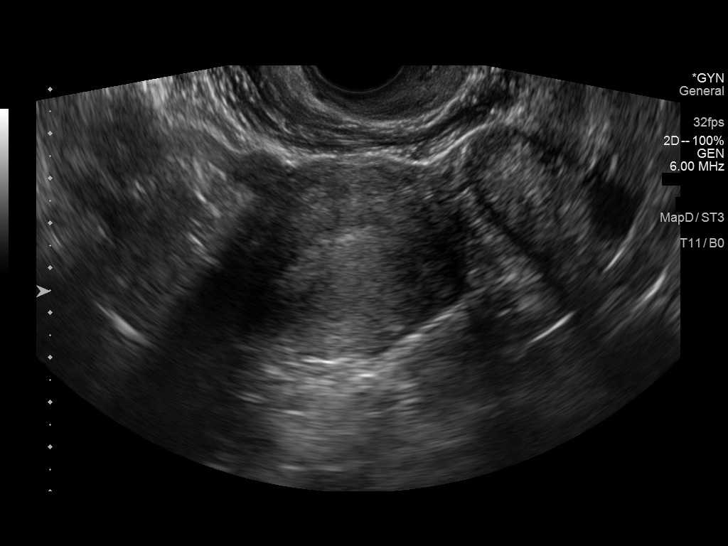
[im 59/59]
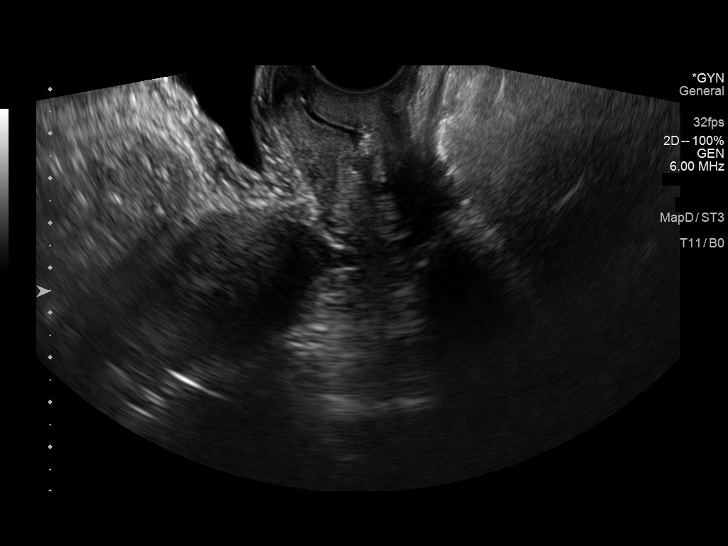

[13 of 25 positions shown; findings below may reference images not displayed]

FINDINGS: Uterus

Measurements: 10.1 x 4.2 x 5.7 cm = volume: 127 mL. No fibroids or
other mass visualized.

Endometrium

Thickness: 8 mm. No focal abnormality visualized. Small amount of
simple fluid noted within the endocervical canal. IUD low lying
within the endometrial cavity at the level of the lower uterine
segment/cervix.

Right ovary

Not visualized.  No adnexal mass.

Left ovary

Measurements: 3.4 x 1.5 x 2.4 cm = volume: 6.6 mL. Normal
appearance/no adnexal mass.

Other findings

No abnormal free fluid.
IMPRESSION: 1. IUD low lying within the endometrial cavity at the level of the
lower uterine segment/endocervical canal.
2. Endometrial stripe measures 8 mm in thickness. If bleeding
remains unresponsive to hormonal or medical therapy, sonohysterogram
should be considered for focal lesion work-up. (Ref: Radiological
Reasoning: Algorithmic Workup of Abnormal Vaginal Bleeding with
Endovaginal Sonography and Sonohysterography. AJR [id number]; 191:S68-73).
3. Nonvisualization of the right ovary. No adnexal mass or free
fluid.
4. Normal left ovary.

## 2021-08-17 ENCOUNTER — Other Ambulatory Visit: Payer: Self-pay | Admitting: Family Medicine

## 2023-03-27 ENCOUNTER — Other Ambulatory Visit: Payer: Self-pay | Admitting: Family Medicine

## 2023-03-27 DIAGNOSIS — Z006 Encounter for examination for normal comparison and control in clinical research program: Secondary | ICD-10-CM

## 2023-04-08 ENCOUNTER — Ambulatory Visit
Admission: RE | Admit: 2023-04-08 | Discharge: 2023-04-08 | Disposition: A | Discharge status: Home / Self Care | Payer: No Typology Code available for payment source | Source: Ambulatory Visit | Attending: Family Medicine | Admitting: Family Medicine

## 2023-04-08 DIAGNOSIS — Z006 Encounter for examination for normal comparison and control in clinical research program: Secondary | ICD-10-CM

## 2023-10-30 DIAGNOSIS — Z1231 Encounter for screening mammogram for malignant neoplasm of breast: Secondary | ICD-10-CM | POA: Diagnosis not present

## 2024-03-06 ENCOUNTER — Other Ambulatory Visit: Payer: Self-pay | Admitting: Family Medicine

## 2024-03-06 DIAGNOSIS — Z006 Encounter for examination for normal comparison and control in clinical research program: Secondary | ICD-10-CM

## 2024-03-09 ENCOUNTER — Telehealth: Payer: Self-pay | Admitting: Diagnostic Neuroimaging

## 2024-03-09 NOTE — Telephone Encounter (Signed)
 Received sleep referral from Dr. Montie Pizza at [facility] for sleep apnea, 50 lb weight loss, non-compliant with CPAP Placed in sleep referrals box

## 2024-03-23 DIAGNOSIS — M170 Bilateral primary osteoarthritis of knee: Secondary | ICD-10-CM | POA: Diagnosis not present

## 2024-09-21 ENCOUNTER — Other Ambulatory Visit: Payer: Self-pay

## 2024-10-06 ENCOUNTER — Other Ambulatory Visit: Payer: Self-pay

## 2024-10-22 ENCOUNTER — Other Ambulatory Visit: Payer: Self-pay
